# Patient Record
Sex: Female | Born: 1955 | Race: White | Hispanic: No | Marital: Married | Smoking: Never smoker
Health system: Southern US, Community
[De-identification: ages and names within clinical notes are randomized; demographics above are authoritative.]

## PROBLEM LIST (undated history)

## (undated) DIAGNOSIS — I4891 Unspecified atrial fibrillation: Secondary | ICD-10-CM

## (undated) DIAGNOSIS — F32A Depression, unspecified: Secondary | ICD-10-CM

## (undated) DIAGNOSIS — I209 Angina pectoris, unspecified: Secondary | ICD-10-CM

## (undated) DIAGNOSIS — I10 Essential (primary) hypertension: Secondary | ICD-10-CM

## (undated) DIAGNOSIS — N200 Calculus of kidney: Secondary | ICD-10-CM

## (undated) DIAGNOSIS — K219 Gastro-esophageal reflux disease without esophagitis: Secondary | ICD-10-CM

## (undated) DIAGNOSIS — I499 Cardiac arrhythmia, unspecified: Secondary | ICD-10-CM

## (undated) DIAGNOSIS — C801 Malignant (primary) neoplasm, unspecified: Secondary | ICD-10-CM

## (undated) DIAGNOSIS — E785 Hyperlipidemia, unspecified: Secondary | ICD-10-CM

## (undated) DIAGNOSIS — F329 Major depressive disorder, single episode, unspecified: Secondary | ICD-10-CM

## (undated) DIAGNOSIS — I2510 Atherosclerotic heart disease of native coronary artery without angina pectoris: Secondary | ICD-10-CM

## (undated) DIAGNOSIS — E119 Type 2 diabetes mellitus without complications: Secondary | ICD-10-CM

## (undated) DIAGNOSIS — J189 Pneumonia, unspecified organism: Secondary | ICD-10-CM

## (undated) DIAGNOSIS — R0602 Shortness of breath: Secondary | ICD-10-CM

## (undated) DIAGNOSIS — F419 Anxiety disorder, unspecified: Secondary | ICD-10-CM

## (undated) DIAGNOSIS — G4730 Sleep apnea, unspecified: Secondary | ICD-10-CM

## (undated) HISTORY — PX: CHOLECYSTECTOMY: SHX55

## (undated) HISTORY — DX: Type 2 diabetes mellitus without complications: E11.9

## (undated) HISTORY — DX: Major depressive disorder, single episode, unspecified: F32.9

## (undated) HISTORY — DX: Hyperlipidemia, unspecified: E78.5

## (undated) HISTORY — DX: Essential (primary) hypertension: I10

## (undated) HISTORY — PX: COLONOSCOPY W/ POLYPECTOMY: SHX1380

## (undated) HISTORY — PX: CARDIAC ELECTROPHYSIOLOGY MAPPING AND ABLATION: SHX1292

## (undated) HISTORY — DX: Unspecified atrial fibrillation: I48.91

## (undated) HISTORY — PX: TONSILLECTOMY: SUR1361

## (undated) HISTORY — DX: Depression, unspecified: F32.A

---

## 1997-06-02 HISTORY — PX: ABDOMINAL HYSTERECTOMY: SHX81

## 1998-12-20 ENCOUNTER — Encounter: Payer: Self-pay | Admitting: Obstetrics and Gynecology

## 1998-12-20 ENCOUNTER — Ambulatory Visit (HOSPITAL_COMMUNITY): Admission: RE | Admit: 1998-12-20 | Discharge: 1998-12-20 | Payer: Self-pay | Admitting: Obstetrics and Gynecology

## 1998-12-24 ENCOUNTER — Ambulatory Visit (HOSPITAL_COMMUNITY): Admission: RE | Admit: 1998-12-24 | Discharge: 1998-12-24 | Payer: Self-pay | Admitting: Obstetrics and Gynecology

## 1998-12-24 ENCOUNTER — Encounter: Payer: Self-pay | Admitting: Obstetrics and Gynecology

## 1999-02-06 ENCOUNTER — Other Ambulatory Visit: Admission: RE | Admit: 1999-02-06 | Discharge: 1999-02-06 | Payer: Self-pay | Admitting: Obstetrics and Gynecology

## 1999-12-26 ENCOUNTER — Encounter: Payer: Self-pay | Admitting: Obstetrics and Gynecology

## 1999-12-26 ENCOUNTER — Ambulatory Visit (HOSPITAL_COMMUNITY): Admission: RE | Admit: 1999-12-26 | Discharge: 1999-12-26 | Payer: Self-pay | Admitting: Obstetrics and Gynecology

## 2000-02-05 ENCOUNTER — Other Ambulatory Visit: Admission: RE | Admit: 2000-02-05 | Discharge: 2000-02-05 | Payer: Self-pay | Admitting: Obstetrics and Gynecology

## 2000-09-28 ENCOUNTER — Ambulatory Visit (HOSPITAL_COMMUNITY): Admission: RE | Admit: 2000-09-28 | Discharge: 2000-09-28 | Payer: Self-pay | Admitting: Gastroenterology

## 2000-09-28 ENCOUNTER — Encounter (INDEPENDENT_AMBULATORY_CARE_PROVIDER_SITE_OTHER): Payer: Self-pay | Admitting: *Deleted

## 2000-12-28 ENCOUNTER — Encounter: Payer: Self-pay | Admitting: Obstetrics and Gynecology

## 2000-12-28 ENCOUNTER — Ambulatory Visit (HOSPITAL_COMMUNITY): Admission: RE | Admit: 2000-12-28 | Discharge: 2000-12-28 | Payer: Self-pay | Admitting: Obstetrics and Gynecology

## 2001-03-08 ENCOUNTER — Other Ambulatory Visit: Admission: RE | Admit: 2001-03-08 | Discharge: 2001-03-08 | Payer: Self-pay | Admitting: Obstetrics and Gynecology

## 2001-08-11 ENCOUNTER — Ambulatory Visit (HOSPITAL_COMMUNITY): Admission: RE | Admit: 2001-08-11 | Discharge: 2001-08-11 | Payer: Self-pay | Admitting: Cardiology

## 2001-08-11 ENCOUNTER — Encounter: Payer: Self-pay | Admitting: Cardiology

## 2002-01-04 ENCOUNTER — Ambulatory Visit (HOSPITAL_COMMUNITY): Admission: RE | Admit: 2002-01-04 | Discharge: 2002-01-04 | Payer: Self-pay | Admitting: Obstetrics and Gynecology

## 2002-01-04 ENCOUNTER — Encounter: Payer: Self-pay | Admitting: Obstetrics and Gynecology

## 2002-04-11 ENCOUNTER — Other Ambulatory Visit: Admission: RE | Admit: 2002-04-11 | Discharge: 2002-04-11 | Payer: Self-pay | Admitting: Obstetrics and Gynecology

## 2003-01-11 ENCOUNTER — Encounter: Payer: Self-pay | Admitting: Obstetrics and Gynecology

## 2003-01-11 ENCOUNTER — Ambulatory Visit (HOSPITAL_COMMUNITY): Admission: RE | Admit: 2003-01-11 | Discharge: 2003-01-11 | Payer: Self-pay | Admitting: Obstetrics and Gynecology

## 2003-01-19 ENCOUNTER — Encounter: Admission: RE | Admit: 2003-01-19 | Discharge: 2003-01-19 | Payer: Self-pay | Admitting: Obstetrics and Gynecology

## 2003-01-19 ENCOUNTER — Encounter: Payer: Self-pay | Admitting: Obstetrics and Gynecology

## 2003-06-05 ENCOUNTER — Other Ambulatory Visit: Admission: RE | Admit: 2003-06-05 | Discharge: 2003-06-05 | Payer: Self-pay | Admitting: Obstetrics and Gynecology

## 2004-03-12 ENCOUNTER — Ambulatory Visit (HOSPITAL_COMMUNITY): Admission: RE | Admit: 2004-03-12 | Discharge: 2004-03-12 | Payer: Self-pay | Admitting: Obstetrics and Gynecology

## 2004-06-02 HISTORY — PX: BLADDER TUMOR EXCISION: SHX238

## 2004-07-15 ENCOUNTER — Other Ambulatory Visit: Admission: RE | Admit: 2004-07-15 | Discharge: 2004-07-15 | Payer: Self-pay | Admitting: Obstetrics and Gynecology

## 2004-08-29 ENCOUNTER — Ambulatory Visit (HOSPITAL_COMMUNITY): Admission: RE | Admit: 2004-08-29 | Discharge: 2004-08-29 | Payer: Self-pay | Admitting: Urology

## 2004-08-29 ENCOUNTER — Encounter (INDEPENDENT_AMBULATORY_CARE_PROVIDER_SITE_OTHER): Payer: Self-pay | Admitting: Specialist

## 2004-08-29 ENCOUNTER — Ambulatory Visit (HOSPITAL_BASED_OUTPATIENT_CLINIC_OR_DEPARTMENT_OTHER): Admission: RE | Admit: 2004-08-29 | Discharge: 2004-08-29 | Payer: Self-pay | Admitting: Urology

## 2005-05-13 ENCOUNTER — Ambulatory Visit (HOSPITAL_COMMUNITY): Admission: RE | Admit: 2005-05-13 | Discharge: 2005-05-13 | Payer: Self-pay | Admitting: Obstetrics and Gynecology

## 2005-10-13 ENCOUNTER — Emergency Department (HOSPITAL_COMMUNITY): Admission: EM | Admit: 2005-10-13 | Discharge: 2005-10-13 | Payer: Self-pay | Admitting: Emergency Medicine

## 2006-05-18 ENCOUNTER — Ambulatory Visit (HOSPITAL_COMMUNITY): Admission: RE | Admit: 2006-05-18 | Discharge: 2006-05-18 | Payer: Self-pay | Admitting: Obstetrics and Gynecology

## 2006-11-09 ENCOUNTER — Ambulatory Visit: Payer: Self-pay | Admitting: Physician Assistant

## 2006-11-13 ENCOUNTER — Ambulatory Visit: Payer: Self-pay | Admitting: Physician Assistant

## 2006-11-18 ENCOUNTER — Ambulatory Visit: Payer: Self-pay | Admitting: Cardiology

## 2006-11-20 ENCOUNTER — Ambulatory Visit: Payer: Self-pay | Admitting: Cardiology

## 2006-11-20 ENCOUNTER — Inpatient Hospital Stay (HOSPITAL_COMMUNITY): Admission: EM | Admit: 2006-11-20 | Discharge: 2006-11-24 | Payer: Self-pay | Admitting: Emergency Medicine

## 2006-11-20 ENCOUNTER — Ambulatory Visit: Payer: Self-pay | Admitting: Physician Assistant

## 2006-12-03 ENCOUNTER — Ambulatory Visit: Payer: Self-pay | Admitting: Cardiology

## 2006-12-18 ENCOUNTER — Ambulatory Visit: Payer: Self-pay | Admitting: Physician Assistant

## 2006-12-21 ENCOUNTER — Inpatient Hospital Stay (HOSPITAL_COMMUNITY): Admission: AD | Admit: 2006-12-21 | Discharge: 2006-12-22 | Payer: Self-pay | Admitting: Internal Medicine

## 2007-01-13 ENCOUNTER — Ambulatory Visit (HOSPITAL_COMMUNITY): Admission: RE | Admit: 2007-01-13 | Discharge: 2007-01-13 | Payer: Self-pay | Admitting: Internal Medicine

## 2007-01-13 ENCOUNTER — Ambulatory Visit: Payer: Self-pay | Admitting: Cardiology

## 2007-01-15 ENCOUNTER — Ambulatory Visit: Payer: Self-pay | Admitting: Internal Medicine

## 2007-03-03 ENCOUNTER — Ambulatory Visit (HOSPITAL_COMMUNITY): Admission: RE | Admit: 2007-03-03 | Discharge: 2007-03-03 | Payer: Self-pay | Admitting: Cardiology

## 2007-03-15 ENCOUNTER — Ambulatory Visit: Payer: Self-pay | Admitting: Internal Medicine

## 2007-06-11 ENCOUNTER — Ambulatory Visit: Payer: Self-pay | Admitting: Internal Medicine

## 2007-07-05 ENCOUNTER — Ambulatory Visit (HOSPITAL_COMMUNITY): Admission: RE | Admit: 2007-07-05 | Discharge: 2007-07-05 | Payer: Self-pay | Admitting: Obstetrics and Gynecology

## 2008-01-05 ENCOUNTER — Encounter: Admission: RE | Admit: 2008-01-05 | Discharge: 2008-01-05 | Payer: Self-pay | Admitting: Obstetrics and Gynecology

## 2008-03-14 ENCOUNTER — Encounter: Admission: RE | Admit: 2008-03-14 | Discharge: 2008-03-14 | Payer: Self-pay | Admitting: Obstetrics and Gynecology

## 2008-08-21 ENCOUNTER — Emergency Department (HOSPITAL_COMMUNITY): Admission: EM | Admit: 2008-08-21 | Discharge: 2008-08-21 | Payer: Self-pay | Admitting: Emergency Medicine

## 2008-08-24 ENCOUNTER — Ambulatory Visit (HOSPITAL_COMMUNITY): Admission: AD | Admit: 2008-08-24 | Discharge: 2008-08-25 | Payer: Self-pay | Admitting: Urology

## 2008-09-07 ENCOUNTER — Ambulatory Visit (HOSPITAL_COMMUNITY): Admission: RE | Admit: 2008-09-07 | Discharge: 2008-09-07 | Payer: Self-pay | Admitting: Urology

## 2008-09-20 DIAGNOSIS — E669 Obesity, unspecified: Secondary | ICD-10-CM | POA: Insufficient documentation

## 2008-09-20 DIAGNOSIS — I4892 Unspecified atrial flutter: Secondary | ICD-10-CM | POA: Insufficient documentation

## 2008-09-20 DIAGNOSIS — R5381 Other malaise: Secondary | ICD-10-CM

## 2008-09-20 DIAGNOSIS — E785 Hyperlipidemia, unspecified: Secondary | ICD-10-CM

## 2008-09-20 DIAGNOSIS — I10 Essential (primary) hypertension: Secondary | ICD-10-CM | POA: Insufficient documentation

## 2008-09-20 DIAGNOSIS — I4891 Unspecified atrial fibrillation: Secondary | ICD-10-CM | POA: Insufficient documentation

## 2008-09-20 DIAGNOSIS — E119 Type 2 diabetes mellitus without complications: Secondary | ICD-10-CM | POA: Insufficient documentation

## 2008-09-20 DIAGNOSIS — R5383 Other fatigue: Secondary | ICD-10-CM

## 2008-10-10 ENCOUNTER — Encounter: Payer: Self-pay | Admitting: Internal Medicine

## 2008-10-11 ENCOUNTER — Encounter: Payer: Self-pay | Admitting: Internal Medicine

## 2008-12-08 ENCOUNTER — Encounter: Payer: Self-pay | Admitting: Internal Medicine

## 2008-12-19 ENCOUNTER — Encounter: Payer: Self-pay | Admitting: Internal Medicine

## 2009-01-02 ENCOUNTER — Encounter: Admission: RE | Admit: 2009-01-02 | Discharge: 2009-01-02 | Payer: Self-pay | Admitting: Obstetrics and Gynecology

## 2009-06-26 ENCOUNTER — Encounter: Payer: Self-pay | Admitting: Internal Medicine

## 2009-08-14 ENCOUNTER — Encounter: Payer: Self-pay | Admitting: Internal Medicine

## 2010-01-09 ENCOUNTER — Encounter: Payer: Self-pay | Admitting: Internal Medicine

## 2010-02-19 ENCOUNTER — Encounter: Payer: Self-pay | Admitting: Internal Medicine

## 2010-06-23 ENCOUNTER — Encounter: Payer: Self-pay | Admitting: Obstetrics and Gynecology

## 2010-07-04 NOTE — Letter (Signed)
Summary: [facility]   [facility]   Imported By: Roderic Ovens 08/22/2009 13:47:44  _____________________________________________________________________  External Attachment:    Type:   Image     Comment:   External Document

## 2010-07-04 NOTE — Consult Note (Signed)
Summary: [facility]  [facility]   Imported By: Marylou Mccoy 10/30/2009 16:45:21  _____________________________________________________________________  External Attachment:    Type:   Image     Comment:   External Document

## 2010-07-04 NOTE — Letter (Signed)
Summary: [facility] Medical Cardiology Follow Up   [facility] Medical Cardiology Follow Up   Imported By: Roderic Ovens 09/26/2009 15:53:29  _____________________________________________________________________  External Attachment:    Type:   Image     Comment:   External Document

## 2010-07-04 NOTE — Letter (Signed)
Summary: [facility] Medical Cardiology Follow Up  [facility] Medical Cardiology Follow Up   Imported By: Roderic Ovens 03/15/2010 12:40:47  _____________________________________________________________________  External Attachment:    Type:   Image     Comment:   External Document

## 2010-07-04 NOTE — Letter (Signed)
Summary: [facility] Medical Arrhythmia Clinic Note   [facility] Medical Arrhythmia Clinic Note   Imported By: Roderic Ovens 03/01/2010 15:49:35  _____________________________________________________________________  External Attachment:    Type:   Image     Comment:   External Document

## 2010-07-15 ENCOUNTER — Other Ambulatory Visit: Payer: Self-pay

## 2010-07-15 ENCOUNTER — Ambulatory Visit
Admission: RE | Admit: 2010-07-15 | Discharge: 2010-07-15 | Disposition: A | Discharge status: Home / Self Care | Payer: BC Managed Care – PPO | Source: Ambulatory Visit

## 2010-07-15 DIAGNOSIS — G5740 Lesion of medial popliteal nerve, unspecified lower limb: Secondary | ICD-10-CM

## 2010-09-03 ENCOUNTER — Other Ambulatory Visit: Payer: Self-pay | Admitting: Obstetrics and Gynecology

## 2010-09-03 DIAGNOSIS — Z1231 Encounter for screening mammogram for malignant neoplasm of breast: Secondary | ICD-10-CM

## 2010-09-05 ENCOUNTER — Ambulatory Visit: Payer: BC Managed Care – PPO

## 2010-09-09 ENCOUNTER — Ambulatory Visit
Admission: RE | Admit: 2010-09-09 | Discharge: 2010-09-09 | Disposition: A | Discharge status: Home / Self Care | Payer: BC Managed Care – PPO | Source: Ambulatory Visit | Attending: Obstetrics and Gynecology | Admitting: Obstetrics and Gynecology

## 2010-09-09 DIAGNOSIS — Z1231 Encounter for screening mammogram for malignant neoplasm of breast: Secondary | ICD-10-CM

## 2010-09-11 ENCOUNTER — Other Ambulatory Visit: Payer: Self-pay | Admitting: Obstetrics and Gynecology

## 2010-09-11 DIAGNOSIS — R928 Other abnormal and inconclusive findings on diagnostic imaging of breast: Secondary | ICD-10-CM

## 2010-09-11 LAB — BASIC METABOLIC PANEL
Calcium: 9 mg/dL (ref 8.4–10.5)
Creatinine, Ser: 0.97 mg/dL (ref 0.4–1.2)
Potassium: 3.8 mEq/L (ref 3.5–5.1)

## 2010-09-11 LAB — PROTIME-INR
INR: 1.2 (ref 0.00–1.49)
Prothrombin Time: 15.2 seconds (ref 11.6–15.2)

## 2010-09-11 LAB — HEMOGLOBIN AND HEMATOCRIT, BLOOD: Hemoglobin: 10.6 g/dL — ABNORMAL LOW (ref 12.0–15.0)

## 2010-09-12 LAB — DIFFERENTIAL
Basophils Absolute: 0 10*3/uL (ref 0.0–0.1)
Basophils Relative: 0 % (ref 0–1)
Eosinophils Absolute: 0.1 10*3/uL (ref 0.0–0.7)
Eosinophils Relative: 1 % (ref 0–5)
Monocytes Absolute: 0.5 10*3/uL (ref 0.1–1.0)
Monocytes Relative: 4 % (ref 3–12)
Neutro Abs: 11.2 10*3/uL — ABNORMAL HIGH (ref 1.7–7.7)

## 2010-09-12 LAB — COMPREHENSIVE METABOLIC PANEL
ALT: 15 U/L (ref 0–35)
AST: 17 U/L (ref 0–37)
Albumin: 3.4 g/dL — ABNORMAL LOW (ref 3.5–5.2)
Alkaline Phosphatase: 66 U/L (ref 39–117)
BUN: 11 mg/dL (ref 6–23)
Chloride: 108 mEq/L (ref 96–112)
Potassium: 3.4 mEq/L — ABNORMAL LOW (ref 3.5–5.1)
Sodium: 142 mEq/L (ref 135–145)
Total Bilirubin: 0.8 mg/dL (ref 0.3–1.2)
Total Protein: 6.8 g/dL (ref 6.0–8.3)

## 2010-09-12 LAB — BASIC METABOLIC PANEL
BUN: 17 mg/dL (ref 6–23)
BUN: 18 mg/dL (ref 6–23)
CO2: 28 mEq/L (ref 19–32)
CO2: 29 mEq/L (ref 19–32)
Calcium: 8.2 mg/dL — ABNORMAL LOW (ref 8.4–10.5)
Chloride: 100 mEq/L (ref 96–112)
Creatinine, Ser: 1.73 mg/dL — ABNORMAL HIGH (ref 0.4–1.2)
GFR calc Af Amer: 38 mL/min — ABNORMAL LOW (ref >60)
Glucose, Bld: 117 mg/dL — ABNORMAL HIGH (ref 70–99)
Potassium: 3.6 mEq/L (ref 3.5–5.1)
Sodium: 137 mEq/L (ref 135–145)

## 2010-09-12 LAB — URINE MICROSCOPIC-ADD ON

## 2010-09-12 LAB — URINALYSIS, ROUTINE W REFLEX MICROSCOPIC
Bilirubin Urine: NEGATIVE
Glucose, UA: NEGATIVE mg/dL
Ketones, ur: 15 mg/dL — AB
Nitrite: NEGATIVE
Specific Gravity, Urine: 1.011 (ref 1.005–1.030)
pH: 8 (ref 5.0–8.0)

## 2010-09-12 LAB — CBC
HCT: 39.1 % (ref 36.0–46.0)
HCT: 30.6 % — ABNORMAL LOW (ref 36.0–46.0)
Hemoglobin: 10.4 g/dL — ABNORMAL LOW (ref 12.0–15.0)
MCHC: 34 g/dL (ref 30.0–36.0)
MCV: 88.4 fL (ref 78.0–100.0)
Platelets: 254 10*3/uL (ref 150–400)
RDW: 13.1 % (ref 11.5–15.5)
RDW: 13.5 % (ref 11.5–15.5)
WBC: 13.6 10*3/uL — ABNORMAL HIGH (ref 4.0–10.5)

## 2010-09-12 LAB — AMYLASE: Amylase: 67 U/L (ref 27–131)

## 2010-09-12 LAB — URINE CULTURE
Colony Count: >=100000
Special Requests: POSITIVE

## 2010-09-12 LAB — HEMOGLOBIN AND HEMATOCRIT, BLOOD: HCT: 36 % (ref 36.0–46.0)

## 2010-09-12 LAB — PROTIME-INR: Prothrombin Time: 40.8 seconds — ABNORMAL HIGH (ref 11.6–15.2)

## 2010-09-12 LAB — APTT: aPTT: 33 seconds (ref 24–37)

## 2010-09-13 ENCOUNTER — Ambulatory Visit
Admission: RE | Admit: 2010-09-13 | Discharge: 2010-09-13 | Disposition: A | Discharge status: Home / Self Care | Payer: BC Managed Care – PPO | Source: Ambulatory Visit | Attending: Obstetrics and Gynecology | Admitting: Obstetrics and Gynecology

## 2010-09-13 DIAGNOSIS — R928 Other abnormal and inconclusive findings on diagnostic imaging of breast: Secondary | ICD-10-CM

## 2010-10-15 NOTE — Assessment & Plan Note (Signed)
[facility]                         ELECTROPHYSIOLOGY OFFICE NOTE   DELBERT, VU                        MRN:          [id number]  DATE:03/15/2007                            DOB:          1955-08-05    Ms. Gloster is seen following cardioversion.  She was initially  cardioverted after a couple weeks of amiodarone.  This failed to hold.  She was then cardioverted again a couple of weeks ago and has been  holding sinus rhythm.   There is also an issue about her statin therapy.  She was on Vytorin.  She was switched over to Zocor, and I told her that she can continue  taking this as long as she is taking less than 20 mg a day.   She is supposed to have an appointment to see Dr. Clydie Braun over  at [facility] to consider pulmonary vein isolation.   We will plan to see her again in 3 months' time.   I should note that on examination today, her blood pressure was 138/82.  Her pulse was 70.  LUNGS:  Clear.  HEART SOUNDS:  Regular.  EXTREMITIES:  Without peripheral edema.   IMPRESSION:  1. Paroxysmal atrial fibrillation following cardioversion x2.  2. Amiodarone for the above at 400 mg daily.  3. Dyslipidemia on statin therapy with Zocor.   Ms. Chalupa is okay.  We are going to continue on her amiodarone at 400  mg a day for right now.  We will hopefully get her in to see Dr.  Sampson Goon in the next number of weeks at which time her amiodarone dose  can be decreased to 300 mg a day.   We will see her again in 3 months' time.     Duke Salvia, MD, [facility]  Electronically Signed    SCK/MedQ  DD: 03/15/2007  DT: 03/15/2007  Job #: [id number]   cc:   Naida Sleight  Dr. Clydie Braun, [facility]

## 2010-10-15 NOTE — Assessment & Plan Note (Signed)
[facility]                         ELECTROPHYSIOLOGY OFFICE NOTE   SORAIYA, AHNER                        MRN:          [id number]  DATE:06/11/2007                            DOB:          [birth year]    HISTORY:  Larra Crunkleton comes in now 3 weeks status post coronary  isolation undertaken by Dr. Sampson Goon.  I have not yet received the  operative notes, but it sounds like she had both right atrial and left  atrial ablation.  She has had no atrial fibrillation since.  Her  amiodarone was discontinued at the time of the procedure.  She has had  occasional flutters.   MEDICATIONS:  1. Diovan HCT.  2. Cardizem.   PHYSICAL EXAMINATION:  VITAL SIGNS:  Her blood pressure today was well  controlled at 123/79, heart rate was 74.  LUNGS:  Clear.  HEART:  Sounds were regular.  EXTREMITIES:  Without edema.   IMPRESSION:  1. Atrial fibrillation status post coronary isolation.  2. Hypertension.  3. Obesity.   Ms. Burbano is stable.  We will see her again in 6 months' time, but if  she has followed up with Dr. Sampson Goon at that point, she will cancel  this appointment here.     Duke Salvia, MD, [facility]  Electronically Signed    SCK/MedQ  DD: 06/11/2007  DT: 06/11/2007  Job #: [id number]   cc:   Learta Codding, MD,FACC  Mick Sell, MD

## 2010-10-15 NOTE — Assessment & Plan Note (Signed)
[facility]                          [place] [facility] OFFICE NOTE   [name], [name]                        MRN:          [id number]  DATE:11/09/2006                            DOB:          1956/03/26    CHIEF COMPLAINT:  Atrial fibrillation/flutter.   HISTORY OF PRESENT ILLNESS:  [name] is a 55 year old female patient  with past history of atrial fibrillation/flutter dating back to 2002.  She had been previously followed by Dr. Dorethea Clan at [facility].  He has subsequently retired and recently she had been  followed by Robet Leu, NP.  She was cardioverted first in 2003.  She  has been on flecainide since that time.  She went back out of rhythm  this past year and was set up for cardioversion in April of 2008.  She  has had no followup with Cardiology at [facility] since that time.  However, the patient notes that within about 12 hours she felt that she  was back out of rhythm.  This was not documented until she saw Dr.  Reuel Boom back in followup a few weeks ago.  She is referred for further  evaluation and treatment of her arrhythmia.   The patient notes that when she is out of rhythm she often feels  fatigued and says that she has no energy, however these symptoms are not  exactly consistent when she goes out of rhythm.  She knows that when she  is in sinus rhythm she often feels the same.  She does however note  palpitations from time to time.  She denies syncope or near syncope.  She does note some chest discomfort.  She notes this sometimes with  exertion.  She also notes it sometimes with rest.  She notes some  symptoms in her right arm as well.  This seems to be more an association  with her palpitations then with anything.  They usually last less than  10 minutes.  There is no associated nausea or diaphoresis.  Relaxation  usually makes the symptoms better.  She has sleep apnea and is on CPAP.  She slept on 2  pillows for years without changes recently.  She did not  notice any paroxysmal nocturnal dyspnea.  Denies any lower extremity  edema.  Denies any cough.   PAST MEDICAL HISTORY:  1. As noted above is significant for atrial fibrillation/flutter with      cardioversion in 2003 and April of 2008.  She is on flecainide and      Coumadin therapy.  Her Coumadin is followed by Dr. Reuel Boom.  2. She underwent stress testing and echocardiogram at [facility] in 2004 - the patient notes that these were okay.      Records are pending.  3. Borderline diabetes mellitus.  4. Hypertension.  5. Hyperlipidemia.  6. History of bladder cancer, status post resection.  7. Nephrolithiasis.  8. Sleep apnea on CPAP therapy.  9. Status post cholecystectomy.  10.Status post total abdominal hysterectomy and bilateral salpingo-  oophorectomy.  11.History of depression.  12.Seasonal allergies.   CURRENT MEDICATIONS:  1. Flecainide 100 mg b.i.d.  2. Prozac 20 mg daily.  3. Cardizem 240 mg daily.  4. Estrogen 2 mg daily.  5. Potassium 10 mEq b.i.d.  6. Vytorin 10/80 mg daily.  7. Diovan/HCT 320/25 mg daily.  8. Allegra 180 mg daily.  9. Coumadin as directed.   ALLERGIES:  SHE DENIES ANY TRUE ALLERGIES, ATENOLOL DID MAKE HER  SEVERELY FATIGUED.   SOCIAL HISTORY:  She denies any tobacco abuse.  Denies any alcohol  abuse.  She is married, has no children.  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   FAMILY HISTORY:  Significant for coronary artery disease.  Her father  had bypass surgery at age 27. Her mother died at age 25 from colon  cancer.  Her father did die at 39 from a stroke.   REVIEW OF SYSTEMS:  Please see HPI.  Denies any fevers, chills.  She has  a nonproductive cough secondary to her allergies.  Denies any  hemoptysis.  Denies any facial drooping or monocular blindness,  unilateral weakness or difficulty with speech.  Denies any melena  or  hematochezia.  She does have occasional hematuria that is followed by  [facility] in [place].  This has been fairly chronic since her  bladder surgery.  Rest of the review of systems are negative.   PHYSICAL EXAMINATION:  She is a well-nourished, well-developed female in  no distress.  Blood pressure is 133/91, pulse 71, weight 220 pounds.  HEENT:  Normal.  NECK:  Without JVD.  LYMPH:  Without lymphadenopathy.  ENDOCRINE:  Without thyromegaly.  CAROTIDS:  Without bruits bilaterally.  CARDIAC:  Normal S1-S2, irregularly irregular rhythm, no murmurs.  LUNGS:  Clear to auscultation bilaterally without wheezing, rhonchi, or  rales.  ABDOMEN:  Soft, nontender, with normoactive bowel sounds, no  organomegaly.  EXTREMITIES:  Without edema bilaterally.  Calves are soft, nontender.  SKIN:  Warm and dry.  NEUROLOGIC:  She is alert and oriented x3, cranial nerves II-XII grossly  intact.   Electrocardiogram reveals probable atrial flutter with variable AV  block, although it can not rule out the possibility of coarse atrial  fibrillation, heart rate 75.   IMPRESSION:  1. Persistent atrial fibrillation/flutter.      a.     History of cardioversion x2, the last of which was in April       2008.      b.     Flecainide therapy.      c.     Coumadin therapy - followed by Dr. Reuel Boom.  2. Chest pain.  3. Hypertension.  4. Hyperlipidemia, treated.  5. Borderline diabetes mellitus.  6. Family history of coronary artery disease.  7. Sleep apnea on continuous positive airway pressure.  8. History of bladder cancer, status post resection.  9. History of depression.  10.Seasonal allergies.  11.Status post total abdominal hysterectomy/bilateral salpingo-      oophorectomy.  12.Status post cholecystectomy.  13.Nephrolithiasis.   PLAN:  The patient presents to the office today for establishment.  She has a history of atrial fibrillation/flutter.  As noted her  electrocardiogram today  looks to be fairly consistent with atrial  flutter with variable atrioventricular block.  However, there is a  possibility that this just represents coarse atrial fibrillation.  In  any event she had previously been followed at [facility].  We will need to  obtain those records.  She is still on  flecainide therapy even though she failed her last cardioversion.  However, she did not really have followup with the doctor at [facility]  after her last cardioversion.  I discussed the patient's case today with  Dr. Myrtis Ser via telephone.  At this point in time we recommend keeping her  on the flecainide and checking a flecainide trough level.  Will also get  an echocardiogram.  We will get an adenosine Cardiolite as she is  complaining of some chest discomfort.  We will also get a 48 hour Holter  monitor to assess her rate control.  I did discuss with her today the  possibility of keeping her in atrial fibrillation and just treating her  with rate control therapy. However she notes that she does feel that her  symptoms are such that she is wants to try to get back into normal sinus  rhythm.  I will have her follow up with either Dr. Myrtis Ser or Dr. Andee Lineman in  the next several weeks after the above testing is completed.  At that  point in time we can make a decision regarding increasing her flecainide  and trying another cardioversion versus another anti-arrhythmic.  She  may indeed need referral to electrophysiology with Dr. Ladona Ridgel or Dr.  Graciela Husbands in [place].  We will obtain records from [facility] as  well as international normalized ratio levels from Dr. Reuel Boom.      Tereso Newcomer, PA-C  Electronically Signed      Luis Abed, MD, [facility]  Electronically Signed   SW/MedQ  DD: 11/09/2006  DT: 11/09/2006  Job #: [id number]

## 2010-10-15 NOTE — Discharge Summary (Signed)
NAMERIFKA, RAMEY NO.:  [id number]   MEDICAL RECORD NO.:  [id number]          PATIENT TYPE:  INP   LOCATION:  3738                         FACILITY:  [facility]   PHYSICIAN:  [name]. [name], [name], [name]:  Jan 22, 1956   DATE OF ADMISSION:  11/20/2006  DATE OF DISCHARGE:  11/24/2006                               DISCHARGE SUMMARY   DISCHARGE DIAGNOSES:  1. Chest discomfort with abnormal Myoview.  2. Coronary artery disease with normal left ventricular function.  3. Hyperglycemia with an elevated hemoglobin A1c.  4. Hyperlipidemia.  5. Atrial fibrillation with controlled ventricular rate.  6. History as noted below.   PRIMARY CARE PHYSICIAN:  [name], [name]   SUMMARY OF HISTORY:  [name] is a 55 year old female who was seen in  the [name] office on the 20th after an adenosine Myoview on June 18 was  found to be abnormal with a small to moderate anterior defect which was  felt to be reversible.  Her EF was 58%.  She was initially seen for  chest tightness over the preceding 6 months with minimal exertion.  Thus, arrangements were made for her admission and cardiac  catheterization.   Her history is notable for atrial fibrillation/flutter dating back to  2002, initially followed at [name] but  established with our group in June 2008.  She has had two cardioversions  in the past.  The most recent was in April 2008.  She also has been  placed on flecainide and Coumadin and followed by [name].  She has a  history of borderline diabetes, hypertension, hyperlipidemia, bladder  cancer status post resection, nephrolithiasis, sleep apnea on CPAP,  cholecystectomy, TAH with BSO, depression, and seasonal allergies.   LABORATORY DATA:  EKGs show atrial fibrillation, normal axis, early R,  nonspecific ST-T wave changes.  Admission H&H 13.5 and 39.4, normal  indices, platelets 267, WBC 8.9.  On the 24th at the time of discharge  H&H was 13.3 and 39.3, normal indices, platelets 255, WBC 10.0.  Admission PTT was 35, PT 21.9, INR 1.8.  On the 24th at the time of  discharge PT was 13.4, INR 1.0.  Admission sodium was 139, potassium  3.3, BUN 8, creatinine 0.56, normal LFTs, glucose 120.  At the time of  discharge sodium was 139, potassium 3.9, BUN 14, creatinine 0.55,  glucose 133.  Hemoglobin A1c was slightly elevated at 6.9.  CK, MBs  relative indices and troponins were within normal limits x3.  TSH was  2.164.  Urinalysis was positive for blood and 100 mg/dl of protein and  squamous epithelial cells.  Fasting lipids showed a total cholesterol of  159, triglycerides 220, HDL 40, LDL 75.   HOSPITAL COURSE:  [name] was admitted to [name].  EP was  asked to see in consultation in regards to her atrial fibrillation and  history of atrial flutter.  Given her recent abnormal Cardiolite, Dr.  Graciela Coleman agreed with cardiac catheterization, long-term Coumadin with her  CHADS score of 1, and probable 1C therapy and follow up with Dr.  Wynona Coleman for pulmonary vein isolation ablation.  Overnight she ruled out  for myocardial infarction.  Cardiac catheterization was performed on  November 23, 2006, by [name].  She had a 30% distal PDA lesion, 40%  ramus, 40% proximal circumflex, normal LV function, 30-40% proximal LAD  at the diagonal #1, 70% proximal diagonal #1, 20-30% percent mid LAD.  [name] favored aggressive risk factor management.  Her Coumadin was  resumed.  The catheterization site was intact.  [name] on the 24th  felt that the patient could be discharged home with outpatient follow-up  with [name] and Graciela Coleman to consider options in regards to  antiarrhythmics after re-anticoagulation.   DISPOSITION:  [name] discharged [name] and according to his  instructions, he asked her to follow up with [name], [name] and  [name].  He asked her to discontinue flecainide.   Her medications  at the time of discharge include:  1. Prozac 20 mg daily.  2. Cardizem CD 240 mg daily.  3. Estrogen daily.  4. Allegra 180 mg daily.  5. Coumadin 6 mg daily.  6. Vytorin.  7. Diovan/HCT 320/25 mg daily.  8. Potassium 10 mEq b.i.d.  9. Metoprolol 25 mg half a tablet b.i.d.   I will contact our [place] office to ask them to contact the patient at  home to provide her with a follow-up appointment with [name] and  ensure that she has a follow-up appointment with [name].  We will  also ask her to obtain a PT/INR with [name] in approximately 1 week.   DISCHARGE TIME:  20 minutes pacing.      [name], [name]      [name]. [name], [name], [name]  Electronically Signed    EW/MEDQ  D:  11/24/2006  T:  11/24/2006  Job:  [id number]   cc:   [name], [name],[name]  [name], [name]

## 2010-10-15 NOTE — Discharge Summary (Signed)
Judy, Coleman NO.:  [id number]   MEDICAL RECORD NO.:  [id number]          PATIENT TYPE:  OIB   LOCATION:  2852                         FACILITY:  [facility]   PHYSICIAN:  Jonelle Sidle, MD DATE OF BIRTH:  [birth year]   DATE OF ADMISSION:  03/03/2007  DATE OF DISCHARGE:  03/03/2007                               DISCHARGE SUMMARY   Dictation and exam greater than 45 minutes.   FINAL DIAGNOSIS:  1. Presenting March 03, 2007 for DCCV after amiodarone load.  2. The patient is in sinus rhythm October 1, DCCV cancelled.  3. The patient is experiencing pass persistent fatigue for the last      month.  4. Amiodarone dose was increased to 400 mg b.i.d. at office visit      August 15 with intention to cardiovert in 10 days.  5. BUT....patient had trouble maintaining a 4-week stable INR during      the month of September 2008.  6. Amiodarone at 0.8 grams daily since August 15 (possible amiodarone      overload).   PLAN:  1. Decrease amiodarone to 200 mg twice daily (could be decreased      further to 200 mg daily if symptoms persist).  2. Liver function studies and thyroid stimulating hormone drawn      October 1 at [facility].  3. Follow-up with Dr. Sherryl Manges at [facility]      to check lab studies and symptoms as well as EKG for rhythm check.   BRIEF HISTORY:  [name] is a 55 year old female.  She has a history  of atrial fibrillation which dates back to 2002. She is symptomatic in  atrial fibrillation with fluttering sensation and fatigue. She has had  two cardioversions in the past, the first lasted quite some time and the  second lasted only 2 days before relapse. Her last cardioversion was  April 2008, at that time she was placed on flecainide.   She became a [facility] patient June 2008 when she reported  substernal chest pain. A Myoview study was mildly abnormal.  She had a  left heart catheterization on  November 23, 2006. At that time she was in  atrial fibrillation.  The study showed that her ejection fraction was  preserved.  She had a diagonal with a 70% stenosis, otherwise mild  residual disease in the mid-LAD and the first obtuse marginal as well as  the PDA. Her flecainide was discontinued because of her mild coronary  disease.  She was discharged on metoprolol but apparently this caused  weight gain and fatigue as well.   She was seen by Dr. Andee Lineman December 18, 2006. She was set up for inpatient  Tikosyn therapy after consulting with Dr. Graciela Husbands.  The patient presented  December 21, 2006. Her QTc was prolonged at 470.  The patient did not  receive Tikosyn but was started on amiodarone to return for  cardioversion after 4 weeks of therapeutic Coumadin. She presented  August 13 to [facility] with therapeutic INR.  She underwent  direct current cardioversion after conscious sedation and converted to  sinus rhythm. She went home the same day, August 13, to be followed up  by Dr. Graciela Husbands on August 15.   When she saw Dr. Graciela Husbands on August 15 it was found that she had converted  to recurrent atrial fibrillation. Her amiodarone was increased from 200  mg twice a day to 400 mg twice a day with plans to bring her back in  about 10 days for re-cardioversion.  This would have placed her at  return in about the first of September. because of issues with  subtherapeutic INR, the patient was not able to achieve a 4-week stable  INR until the beginning of October.  She presents now to [facility] for this re-cardioversion after further amiodarone loading.   It turns out that the patient has been taking 0.8 grams of amiodarone  for the last month. The patient says she feels very fatigued, hardly  able to do anything.  She is in sinus rhythm and therefore no  cardioversion was attempted today, October 1.   After consultation with Dr. Graciela Husbands, it was decided that we would decrease  the amiodarone  from 400 mg twice daily to 200 mg twice daily.  We could  reduce the dose further if fatigue persisted. She will follow-up with  Dr. Graciela Husbands in the office at the earliest convenience to check if she is  still in sinus rhythm and also to relate possible amelioration of  symptoms with a lower amiodarone dose. At this presentation, liver  function studies and thyroid stimulating hormone have been obtained and  the results will be checked when she presents to the office to see Dr.  Graciela Husbands some time before October 13.   DISCHARGE MEDICATIONS:  1. Amiodarone 200 mg in the morning, 200 mg in the evening.  2. Allegra 180 mg daily.  3. Potassium chloride 10 mEq in the morning. 10 mEq in the evening.  4. Diovan/HCTZ 320/25 daily.  5. Glucosamine.  6. Vitamins C and E.  7. Cardizem 240 mg daily.  8. Prozac 20 mg daily.  9. Estrogen 2 mg daily.  10.Coumadin 5 mg daily or as directed by the Coumadin Clinic.   Her other medical history includes hypertension, dyslipidemia,  borderline diabetes, sleep apnea, history of bladder tumor with mild  hematuria persisting.      Maple Mirza, [place]      Jonelle Sidle, MD  Electronically Signed    GM/MEDQ  D:  03/03/2007  T:  03/04/2007  Job:  [id number]   cc:   Duke Salvia, MD, [facility]

## 2010-10-15 NOTE — Op Note (Signed)
[name], [name]               ACCOUNT NO.:  [id number]   MEDICAL RECORD NO.:  [id number]          PATIENT TYPE:  OIB   LOCATION:  0098                         FACILITY:  [facility]   PHYSICIAN:  Excell Seltzer. Annabell Howells, M.D.    DATE OF BIRTH:  [birth year]   DATE OF PROCEDURE:  08/24/2008  DATE OF DISCHARGE:                               OPERATIVE REPORT   PROCEDURE:  Cystoscopy, left retrograde pyelogram, insertion of left  double-J stent.   PREOPERATIVE DIAGNOSIS:  Left ureteropelvic junction stone.   POSTOPERATIVE DIAGNOSIS:  Left ureteropelvic junction stone with  pyonephrosis and evidence of follicular cystitis.   SURGEON:  Dr. Bjorn Pippin.   ANESTHESIA:  General.   SPECIMEN:  Urine culture from bladder and kidney.   DRAIN:  6-French 24-cm double-J stent.   COMPLICATIONS:  None.   INDICATIONS:  [name] is a 55 year old white female with a history of  stones who had the onset earlier this week of left flank pain.  CT in  the ER revealed a 4 x 8-mm left proximal stone.  She has had persistent  pain and nausea and vomiting and came to the office today.  She also  been feeling hot with shaking chills but no fever.  A KUB in the office  revealed what appeared to be a 5 x 13-mm ovoid stone in the area of the  left UPJ consistent with a stone seen on CT scan.  It was felt that  cystoscopy and stenting was indicated to relieve her pain since she had  been on Coumadin, and her INR had jumped up to 4.5 with an increased  dose.  She will have to have definitive treatment at a later date.   FINDINGS OF PROCEDURE:  The patient was given Cipro.  She was taken to  the operating room, and general anesthetic was induced.  She was placed  in lithotomy position.  Her perineum and genitalia were prepped with  Betadine solution.  She was draped in the usual sterile fashion.  A time  out was performed.  Cysto was performed using a 22-French scope and a 12-  degree lens.  Examination revealed a normal  urethra.  The bladder wall  had mild trabeculation.  There were changes consistent with follicular  cystitis particularly on the posterior wall.  Ureteral orifices were  unremarkable.   The left ureteral orifice was cannulated with a 5-French open-ended  catheter which was advanced into the to the level of stone without  difficulty.  There was some difficulty bypassing the stone, so a sensor  guidewire was passed through the opened-ended catheter by the stone into  the kidney, and then the open-ended catheter was advanced.   A urine specimen was collected on initial cystoscopy.  Her urine was  quite dark and blood tinged.  Once the open-ended catheter was placed  into the kidney, a syringe was used to aspirate content, and she was  noted to have significant pyelonephrosis.  A smaller amount of contrast  was instilled to confirm position.  This demonstrated some blunting of  the calyces without significant filling  defects.  The stone was not well  seen on fluoroscopy.   Once the retrograde pyelogram which revealed some hydronephrosis and the  position in the kidney was completed, the guidewire was reinserted to  the kidney.  The open-ended catheter was removed, and a 6-French 24-cm  double-J stent without string was passed to the kidney under  fluoroscopic guidance.  The wire was removed leaving a good coil in the  kidney and good coil in the bladder.  Purulent urine was noted to efflux  the distally.  At this point, the bladder was drained, and the  cystoscope was removed.  Of note, during the procedure, the patient did  spike a temperature to approximately 101, and her heart rate was  somewhat labile.  It was felt that she would need to be admitted for IV  antibiotics.  There were no complications during the procedure.  Her  anesthetic was reversed.  She was moved to the recovery room in stable  condition.      Excell Seltzer. Annabell Howells, M.D.  Electronically Signed     JJW/MEDQ  D:   08/24/2008  T:  08/24/2008  Job:  [id number]   cc:   Donzetta Sprung  Fax: [phone]

## 2010-10-15 NOTE — Letter (Signed)
January 15, 2007    Learta Codding, MD,FACC  [address]  [place], [place] [address]   RE:  Judy, Coleman  MRN:  [id number]  /  DOB:  1956-01-31   Dear Michelle Piper,   [name] was seen in the hospital a couple of weeks ago for atrial  fibrillation.  We tried Tikosyn, but because of QT prolongation, she did  not take it and she was started on amiodarone.  She came in earlier this  week for cardioversion, which was successful.  However, she has reverted  now to atrial fibrillation.   She is not as symptomatic today as she has been.   Her medications include Metoprolol 25 b.i.d., Diovan 320/25.   Vytorin has been discontinued because of the interactions with  amiodarone.  She is on Cardizem and Prozac.   EXAMINATION TODAY:  Her blood pressure is 136/85, her pulse is 67 and  irregular.  Lungs were clear.  Heart sounds were irregular with an early  systolic murmur.  The extremities were without edema.   We obtained an electrocardiogram today, which demonstrated atrial  fibrillation having recurred.   IMPRESSION:  1. Atrial fibrillation - persistent.  2. Modest nonobstructive coronary disease.  3. QT prolongation, precluding Tikosyn.  4. Current therapy with amiodarone.  5. Coumadin therapy, given thromboembolic risk factors notable for      diabetes and hypertension.   Michelle Piper, [name] has recurrent atrial fibrillation.  I am going to go  ahead and increase her dose and give her about ten days and then  cardiovert her again.  In the interim, I am also going to contact Dr.  Clydie Braun for consideration of pulmonary vein isolation.    Sincerely,      Duke Salvia, MD, [facility]  Electronically Signed    SCK/MedQ  DD: 01/15/2007  DT: 01/16/2007  Job #: [id number]   CC:    Donzetta Sprung

## 2010-10-15 NOTE — Assessment & Plan Note (Signed)
[facility]                          [facility] NOTE   [name], [name]                        MRN:          [id number]  DATE:12/18/2006                            DOB:          07-Nov-1955    REFERRING PHYSICIAN:  Donzetta Sprung   HISTORY OF PRESENT ILLNESS:  The patient is a 55 year old female with a  history of atrial fibrillation and flutter dating back to 2002. She was  previously followed at [facility] by Dr. Cloyde Reams. Most recently, she was  seeing the PA there. She was quite dissatisfied with him and has  transferred her care to the [place] office. When she was seen recently by  Lorin Picket and myself, the patient reported a chest pain. A Cardiolite study  was done which was abnormal. Subsequently, the patient was set up for a  catheterization. The catheterization essentially showed that she had non-  obstructive coronary artery disease. She did have moderate plaque in the  proximal to mid LAD. There was also mild to moderate plaque in the  circumflex coronary artery. It was felt that the patient could be  treated medically. We also asked the patient during this hospitalization  to see Dr. Graciela Husbands, as she remained in atrial fibrillation and we had  questions of whether the patient needed to continue on Flecainide. Dr.  Graciela Husbands saw the patient at that time and gave a recommendation to  discontinue Flecainide and to refer her for radio frequency ablation.  When the patient came to back to see [place] in the office today was doing  quite well. She had no groin complication and no chest pain or shortness  of breath.   However, a couple of days ago, the patient called our nurse and stated  that after she was discharged from [facility] and was placed on  Metoprolol, she started feeling worse. She states that she developed  lower extremity edema. Her weight is up approximately 10 pounds from  previous. She also feels that she has frequent  palpitations.   MEDICATIONS:  1. Prozac 20 mg daily.  2. Cardizem 240 mg daily.  3. Estrogen 2 mg daily.  4. Potassium 10 mEq b.i.d.  5. Vytorin 10/80 mg daily.  6. Diovan/hydrochlorothiazide 320/25 mg daily.  7. Allegra 180 mg daily.  8. Coumadin as directed.  9. Metoprolol 25 mg 1/2 tablet b.i.d.   PHYSICAL EXAMINATION:  VITAL SIGNS:  Blood pressure 128/86, heart rate  66 beats-per-minute, weight 223 pounds.  GENERAL:  Well nourished white female in no apparent distress.  HEENT:  Normal carotid upstrokes; no carotid bruits.  LUNGS:  Clear breath sounds bilaterally.  HEART:  Irregular rate and rhythm. Normal S1, S2.  ABDOMEN:  Soft and nontender. No rebound or guarding. Good bowel sounds.  EXTREMITIES:  No cyanosis or clubbing. There is trace to 1+ pitting  edema.   PROBLEM LIST:  1. Persistent atrial fibrillation.      a.     History of cardioversion x2 last April 2008.      b.     Flecainide discontinued by Dr. Graciela Husbands with  initial       recommendation for radio frequency ablation.      c.     Coumadin therapy followed by Dr. Reuel Boom.  2. Abnormal Cardiolite study with non-obstructive coronary artery      disease by catheterization.  3. Hypertension.  4. Dyslipidemia.  5. Borderline diabetes mellitus.  6. Sleep apnea.  7. Mild lower extremity edema.   PLAN:  1. The patient does appear to have gained some weight. However, she      has normal LV systolic function. She may have diastolic      dysfunction. I have given her a prescription of Lasix only 10 mg      for three days to bring her back to her baseline weight.  2. Initially the patient was recommended by Dr. Graciela Husbands to undergo radio      frequency ablation, however when I discussed this with Dr. Graciela Husbands in      the office today, he decided to bring her into the hospital and      start her on Tikosyn therapy. I have discussed this with the      patient at great length and she is willing to proceed with this.      She is  willing to come in next Monday at [facility].  3. Continued risk factor modification for the patient's mild coronary      artery disease.  4. I am still not entirely clear of why the patient feels worse after      she was placed on Metoprolol therapy. Her heart rate is well      controlled here in the office, but she reports to me that she has      frequent palpitations. I have suggested Dr. Graciela Husbands to consider      CardioNet monitor to actually evaluate her rhythm, but he felt that      this was not necessary at this point in time.     Learta Codding, MD,FACC  Electronically Signed    GED/MedQ  DD: 12/18/2006  DT: 12/19/2006  Job #: [id number]   cc:   Donzetta Sprung

## 2010-10-15 NOTE — Discharge Summary (Signed)
NAMEJORDAIN, RADIN NO.:  [id number]   MEDICAL RECORD NO.:  [id number]          PATIENT TYPE:  INP   LOCATION:  3734                         FACILITY:  [facility]   PHYSICIAN:  Duke Salvia, MD, FACCDATE OF BIRTH:  [birth year]   DATE OF ADMISSION:  12/21/2006  DATE OF DISCHARGE:  12/22/2006                               DISCHARGE SUMMARY   This patient has allergy to ATENOLOL, which causes fatigue.   Dictation and exam greater than 40 minutes.   FINAL DIAGNOSES:  1. Recurrent atrial fibrillation, which causes fatigue.  2. Long, QTc on electrocardiogram this admission.  3. Starting amiodarone oral therapy with follow-up cardioversion      Wednesday, August 13.   SECONDARY DIAGNOSES:  1. Atrial fibrillation first diagnosed 2002.      a.     Symptoms are a fluttering sensation and fatigue.      b.     Two direct current cardioversions in the past.      c.     Flecainide therapy stopped after discovery of moderate but       nonobstructive coronary artery disease at left heart       catheterization November 23, 2006.  2. Admission July 21 to start Tikosyn therapy aborted secondary to      long QT.  3. Ejection fraction of 58% at left heart catheterization November 23, 2006, for substernal chest pain.  The study showed mid left      anterior descending artery lesion of 30-40%.  The diagonal had a      70% lesion, The first obtuse marginal 30-40%.  The takeoff of the      posterior descending artery, had 30% lesion.  The patient is to      continue to manage medically.  4. Hypertension.  5. Dyslipidemia.  6. Borderline diabetes.  7. Sleep apnea.  8. History of bladder tumor with mild hematuria.   PROCEDURES:  None here today.  The patient started on amiodarone  therapy.   BRIEF HISTORY:  Ms. Capano is a 55 year old female who has recurrent  atrial fibrillation.  With fibrillation she feels a fluttering sensation  and she has fatigue.  She has had two  cardioversions in the past.  The  first one lasted quite some time, the second one only lasted 2 days  prior to relapse.  Her last cardioversion was in April 2008.  At that  time she was placed on flecainide.  Her Coumadin is followed by Dr.  Reuel Boom.   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  She had a Myoview study November 18, 2006, which demonstrated a small  to moderate anterior defect.  Ejection fraction was 58%.  Because of  this she had left heart catheterization November 23, 2006.  At that time she  was in atrial fibrillation.  This study showed that her EF was  preserved.  The mid LAD had a 30-40% lesion, diagonal 70%, first obtuse  marginal 30-40%, takeoff of the PDA was 30%.  This  was nonobstructive  coronary artery disease with medical management BUT flecainide  discontinued.  She was discharged on metoprolol, which apparently causes  weight gain and fatigue.  She was seen by Dr. Andee Lineman December 18, 2006, and  set up for inpatient Tikosyn after consulting with Dr. Graciela Husbands.  The  patient presents electively July 21 to begin Tikosyn therapy.   HOSPITAL COURSE:  The patient presents electively for Tikosyn therapy.  Her INR was therapeutic at 2.3,  Her potassium was low at 3.5.  This was  replenished.  Her magnesium was okay at 1.8.  She was taking  hydrochlorothiazide.  This was placed on hold, as was her Allegra.  Everything was prepared to begin Tikosyn therapy on Tuesday, July 22;  however, her electrocardiogram showed that her QT was prolonged, QTc was  470.  The patient prepared for discharge July 22 and started on  amiodarone therapy with outpatient cardioversion Wednesday, August 13.  She will have her Coumadin monitored by the [facility] on  a weekly basis.   Her medications at discharge include:  1. Amiodarone 200 mg tablets 2 in the morning and 2 in the evening      from July 22 to August 5 and then to start 2 tablets daily       Wednesday, August 6.  2. She is to stop metoprolol.  3. Cardizem XL 240 mg.  She is to stop this if her heart rate dips      below 70.  4. Allegra 180 mg daily.  5. Vytorin 10/80 mg daily.  6. Potassium chloride increased slightly, 20 mEq in the morning and 10      mEq in the evening.  7. Diovan/hydrochlorothiazide 320/25 mg daily.  8. Prozac 20 mg daily.  9. Estrogen 2 mg daily.  10.Coumadin 6 mg daily, to reduce to 5 mg daily on Sunday, July 27.  11.Glucosamine 100 mg daily.  12.Vitamin C 500 mg and vitamin E 400 international units daily.   Follow-up with [facility] for pro time Monday, July 28.  She returns to [facility] Wednesday, August 13, at 60.  She  is asked to eat nothing after midnight Tuesday, August 12.  She is to be  on clear liquids, however, until 9 o'clock on August 13.  Dr. Graciela Husbands will  perform the cardioversion at approximately 1 o'clock.  It is asked that  the [facility] and all pro times to [facility],  the fax number is [phone].     Laboratories at discharge on July 22:  Sodium 144, potassium is 4.4,  chloride 110, carbonate 29, BUN is 10, creatinine 0.53, glucose 141.  Her pro time is 28.2, INR is 2.5.   Once again, greater than 40 minutes      Maple Mirza, [place]      Duke Salvia, MD, [facility]  Electronically Signed    GM/MEDQ  D:  12/22/2006  T:  12/22/2006  Job:  [id number]   cc:   Learta Codding, MD,FACC  Donzetta Sprung  [facility]

## 2010-10-15 NOTE — Cardiovascular Report (Signed)
[name], [name] NO.:  [id number]   MEDICAL RECORD NO.:  [id number]          PATIENT TYPE:  INP   LOCATION:  3738                         FACILITY:  [facility]   PHYSICIAN:  Arturo Morton. [name], MD, FACCDATE OF BIRTH:  02-14-1956   DATE OF PROCEDURE:  11/23/2006  DATE OF DISCHARGE:                            CARDIAC CATHETERIZATION   INDICATIONS:  [name] is a 55 year old who has had a history of  atrial fibrillation.  She has been on warfarin anticoagulation.  She  recently had some chest pain and had an abnormal Myoview scan with some  question of LAD disease.  The current study was done to assess coronary  anatomy.  The risks, benefits and alternatives were discussed with the  patient.  I rediscussed these in the catheterization laboratory with  her.  She was agreeable to proceed. Her Coumadin was on hold and her INR  was controlled.   PROCEDURE:  1. Left heart catheterization.  2. Selective coronary arteriography.  3. Selective left ventriculography.   DESCRIPTION OF PROCEDURE:  The patient was brought to the  catheterization laboratory after informed consent, prepped and draped in  usual fashion. Through an anterior puncture the right femoral artery was  entered using a Smart needle.  A 5-French sheath was placed.  Diagnostic  images were then obtained in multiple angiographic projections using a 5-  [place] Judkins catheters.  Central aortic and left ventricular pressures  were measured with pigtail, ventriculography was performed in the RAO  projection.  There were no complications.  The patient tolerated  procedure well.  She was taken to the holding area in satisfactory  clinical condition.   HEMODYNAMIC DATA.:  1. Central aortic pressure 133/87, mean 109.  2. Left ventricular pressure 121/16.  3. No gradient on pullback across aortic valve.   ANGIOGRAPHIC DATA.:  1. Ventriculography was done in the RAO projection.  The patient was      in  atrial fibrillation. Ventriculography in the RAO projection      revealed preserved systolic function and no definite wall motion      abnormalities were visualized although as noted, the patient had      variable cycle lengths with her atrial fibrillation.  2. The left main coronary artery is large in caliber and is free of      critical disease.  3. The left anterior descending artery courses to the apex.  The LAD      demonstrates a segmental area of plaquing in the mid vessel      overlying a small diagonal branch and past the first septal      perforator.  In the midportion of the vessel there is segmental      plaque that measures about 30% luminal reduction throughout the      segment.  At its worst spot, there probably is a focal area of      about 40% just after the takeoff of the small diagonal.  The      diagonal is insignificant and has what appears to be about 70% area  of narrowing.  Just after this, the vessel opens up and there is      some mild calcification and some diffuse luminal irregularity of      the remainder of the LAD with some 20-30% plaquing in the mid to      distal vessel.  The stenosis in the LAD does not appear to be      critical in any view  4. The circumflex is a large-caliber vessel providing two large      marginal branches and posterolateral branch.  The first large      marginal branch has about 30-40% narrowing proximally over a      segmental area and then opens up.  Between the origins of the first      and second marginal branch is a focal area of stenosis measuring      about 40% luminal reduction.  Distally the vessel then opens up and      provides a major marginal branch then a tiny insignificant third      marginal branch. There is a large posterolateral branch that also      appears free of critical disease.  5. The right coronary artery is a vessel that provides more or less a      single posterior descending branch.  It is moderate  size and the      vessel itself has mild luminal irregularity. At the takeoff of the      PDA there is perhaps about 30% mild plaquing just after the small      posterolateral branch origin.  Again the posterolateral system is      really insignificant.   CONCLUSION:  1. Persistent atrial fibrillation.  2. Well-preserved left ventricular function.  3. Moderate plaquing of the proximal/mid left anterior descending      artery as noted in the above text.  4. Mild to moderate plaquing of the circumflex coronary artery.   DISPOSITION:  None of the lesions appear to be highly obstructive.  Aggressive risk factor reduction will be recommended from a coronary  standpoint.  Dr. Graciela Husbands is managing her atrial fibrillation.  The patient  will be restarted back on her Coumadin, and further decisions made  regarding this.  Control of her hypertension and diabetes would be  highly recommended.      Arturo Morton. [name], MD, [facility]  Electronically Signed     TDS/MEDQ  D:  11/23/2006  T:  11/23/2006  Job:  [id number]   cc:   Noah Charon, MD,FACC  Duke Salvia, MD, [facility]

## 2010-10-15 NOTE — Letter (Signed)
January 13, 2007    Donzetta Sprung  [address], [address]  [place], [place] [address]   RE:  RENDY, LAZARD  MRN:  [id number]  /  DOB:  [birth year]   Dear Shirlyn Goltz set Rolla Plate up for a cardioversion after starting  amiodarone.  This was performed successfully at [facility].  She converted with one shock to sinus rhythm.  She is on maintenance  amiodarone and will be seeing Dr. Graciela Husbands in followup.  I did note that  she is on Vytorin, there has been a recent report of interaction with  Zocor and amiodarone, and as a result I have instructed her to stop her  Vytorin.  I have asked her to follow up with you for a replacement  therapy for lipid lowering.   Brett Canales will communicate with you when he sees her back.    Sincerely,      Arturo Morton. [name], MD, [facility]  Electronically Signed    TDS/MedQ  DD: 01/13/2007  DT: 01/14/2007  Job #: [id number]   CC:    Duke Salvia, MD, [facility]

## 2010-10-15 NOTE — Assessment & Plan Note (Signed)
[facility]                          [facility]   [name], [name] Coleman                        MRN:          [id number]  DATE:11/20/2006                            DOB:          [birth year]    HOSPITAL ADMISSION HISTORY AND PHYSICAL   PRIMARY CARE PHYSICIAN:  Dr. Donzetta Sprung.   CHIEF COMPLAINT:  Chest pain, positive Cardiolite.   HISTORY OF PRESENT ILLNESS:  [name] [name] Coleman is a 55 year old female patient  with a history of atrial fibrillation/flutter dating back to 2002 who I  saw initially on November 09, 2006. She established with our practice at that  time. She had previously been followed at [facility]. She was mildly symptomatic with her atrial  fibrillation/flutter and was interested in getting back into normal  sinus rhythm. She had been cardioverted on 2 occasions in the past. She  also describes some chest discomfort, and she had significant risk  factors for coronary disease, and we set her up for an adenosine  Cardiolite. She did have a 48-hour Holter monitor that revealed atrial  fibrillation/flutter, and her heart rate was fairly well controlled with  a mean heart rate in the 80s. Her adenosine Cardiolite was done on November 18, 2006 and returned abnormal with a small to moderate anterior defect  which appeared to be reversible in the distribution of the LAD. Her EF  was 58%. She was brought back today for followup. In the office, she  notes that she is having chest tightness. She actually admits over the  last 6 months to having chest tightness with any minimal exertion that  she does. She says going up steps is the worse. She does note associated  shortness of breath. She denies any shortness of breath in the office  today, nausea, diaphoresis. She denies any syncope or near syncope.   PAST MEDICAL HISTORY:  As noted above, significant for:  1. Atrial fibrillation/flutter.      a.     Status post  cardioversion x1, the last of which was April       2008.      b.     Flecainide therapy.      c.     Coumadin therapy followed by Dr. Reuel Boom.  2. Borderline diabetes mellitus.  3. Hypertension.  4. Hyperlipidemia.  5. History of bladder cancer status post resection.  6. Nephrolithiasis.  7. Sleep apnea on CPAP therapy.  8. Status post cholecystectomy.  9. Status post total abdominal hysterectomy with bilateral salpingo-      oophorectomy.  10.History of depression.  11.Seasonal allergies.   ALLERGIES:  No known drug allergies. ATENOLOL DOES CAUSE HER FATIGUE.   MEDICATIONS:  1. Flecainide 100 mg b.i.d.  2. Prozac 20 mg daily.  3. Cardizem 340 mg daily.  4. Estrogen 2 mg daily.  5. Potassium 10 mEq b.i.d.  6. Vytorin 10/80 mg daily.  7. Diovan HCT 320/25 mg daily.  8. Allegra 180 mg daily.  9. Coumadin as directed.   SOCIAL HISTORY:  She denies tobacco or alcohol abuse. She is  married and  has no children. 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   FAMILY HISTORY:  Significant for coronary disease. Her father had bypass  surgery at age 54, and her mother died at age 41 from colon cancer.   REVIEW OF SYSTEMS:  Please see H&P. Denies any melena, hematochezia,  hematuria, dysuria, fevers, chills or cough. The rest of review of  systems is negative.   PHYSICAL EXAMINATION:  She is a well-nourished, well-developed female  who is quite tearful in the office. She is in no acute distress. Blood  pressure 119/79, pulse 100, weight 221 pounds.  HEENT:  Is normal.  NECK:  Without JVD. Carotids without bruits bilaterally.  ENDOCRINE:  Without thyromegaly.  LYMPH:  Without lymphadenopathy.  CARDIAC:  Normal S1 and S2. Irregular, irregular rhythm.  LUNGS:  Were clear to auscultation bilaterally without wheeze, rhonchi  or rales.  ABDOMEN:  Soft, nontender with normal active bowel sounds. No  organomegaly.  EXTREMITIES:  With trace edema  bilaterally. Calves soft, nontender.  SKIN:  Warm, dry.  NEUROLOGICAL:  She is alert and oriented x3. Cranial nerves II-XII  grossly intact.   Electrocardiogram reveals atrial fibrillation with a heart rate of 90,  normal axis, no acute changes.   IMPRESSION:  1. Unstable angina pectoris. Cardiolite study positive for anterior      ischemia.  2. Good left ventricular function.  3. Atrial fibrillation/flutter.      a.     Coumadin therapy.      b.     Flecainide therapy.  4. Hypertension.  5. Hyperlipidemia.  6. Borderline diabetes mellitus.  7. Family history of coronary artery disease.  8. Sleep apnea on CPAP therapy.  9. History of bladder cancer status post resection.  10.History of depression.  11.Seasonal allergies.  12.Status post total abdominal hysterectomy/bilateral salpingo-      oophorectomy.  13.Status post cholecystectomy.  14.History of nephrolithiasis.   PLAN:  The patient presents to the office for followup on her Cardiolite  study and is noting chest discomfort that is concerning for unstable  angina pectoris. In light of her positive Cardiolite and positive risk  factors, we recommend direct admission to [facility] today. She  will need to be taken off of her Coumadin to be able to proceed with  cardiac catheterization. At this point in time, her electrocardiogram is  nonacute. She will be transported to [facility] via EMS. Once  her INR is less than 2, she will be placed on heparin. We will add low-  dose Lopressor at 12.5 mg twice a day as well as nitroglycerin drip. She  will be also placed on aspirin. We will check cardiac markers. We will  get the electrophysiology service to see the patient to help with  control with her atrial fibrillation/flutter. She may need her  flecainide therapy increased or changed to another antiarrhythmic agent.  Risks and benefits of cardiac catheterization have been explained to the patient, and she  agrees to proceed. She was also interviewed and  examined by Dr. Andee Lineman today.      Tereso Newcomer, PA-C  Electronically Signed      Learta Codding, MD,FACC  Electronically Signed   SW/MedQ  DD: 11/20/2006  DT: 11/20/2006  Job #: [id number]   cc:   Donzetta Sprung

## 2010-10-15 NOTE — Op Note (Signed)
[name], [name]               ACCOUNT NO.:  [id number]   MEDICAL RECORD NO.:  [id number]          PATIENT TYPE:  AMB   LOCATION:  DAY                          FACILITY:  [facility]   PHYSICIAN:  Excell Seltzer. Annabell Howells, M.D.    DATE OF BIRTH:  20-Nov-1955   DATE OF PROCEDURE:  09/07/2008  DATE OF DISCHARGE:                               OPERATIVE REPORT   PROCEDURES:  1. Cystoscopy with removal of left double-J stent.  2. Left ureteroscopy with holmium lasertripsy.  3. Left retrograde pyelogram.  4. Interpretation and insertion of left double-J stent.   PREOPERATIVE DIAGNOSIS:  Radiolucent left ureteropelvic junction stone.   POSTOPERATIVE DIAGNOSIS:  Radiolucent left ureteropelvic junction stone.   SURGEON:  Dr. Bjorn Pippin.   ANESTHESIA:  General.   SPECIMEN:  None.   DRAINS:  A 24 cm 6-French double-J stent with string.   COMPLICATIONS:  None.   INDICATIONS:  [name] is a 55 year old white female with a history of  stones, who recently presented with severe left lank pain and was found  to have an approximately 7 x 10 mm left UPJ stone.  She underwent  stenting as initial therapy because she was on Coumadin.  At the time of  stenting, she was found to have pyonephrosis.  She returns today for  definitive therapy and has been off her Coumadin.   FINDINGS AND PROCEDURE:  [name] was taken to the operating room where  she was given Cipro and fitted with PAS hose.  A general anesthetic was  induced.  She was placed in lithotomy position.  Her perineum and  genitalia were prepped with Betadine solution and she was draped in the  usual sterile fashion.  A timeout was performed.  Cystoscopy was  performed using a 22-French scope and 12 degrees lens and grasping  forceps.  The stent from her left ureteral orifice was identified and  brought to the outside where a guidewire was passed through the stent to  the kidney under fluoroscopic guidance.  This stent was then removed  leaving the  guidewire in place.  A 12/14 38-cm ureteral access sheath  was then placed to the kidney and the inner core was removed along with  the guidewire.   A 6-French digital ureteroscope was then passed per the access sheath to  the kidney and the collecting system was visualized.  No stone material  was noted in the upper or mid caliceal system.  However, the stone was  eventually identified in a lower pole calix.   Two different baskets were used to try to retrieve the stone.  However,  the stone was found to be quite friable, consisting probably of struvite  and was so soft that when a portion of the stone was placed in the  basket and the basket was closed, the basket would cut right through the  stone material.  Eventually after considerable effort, the stone was  reduced to small fragments using both the baskets and a 200 micron  holmium laser fiber set on 0.2 watts and 50 Hz.  Once the stone had been  reduced to small fragments, a few fragments and clots were removed with  both irrigation and with the basket.  At this point, the ureteroscope  was placed within the collecting system and contrast was instilled  allowing identification of all caliceal elements.  No evidence of  filling defects were noted in any of the calyces.  Visual inspection of  all of the calyces revealed no significant residual stone fragments.  At  this point, the ureteroscope was removed and a 5-French open-end  catheter was placed to the kidney and additional irrigation was  performed with return of some very fine grit.  A guidewire was then  passed through the access sheath to the kidney.  The access sheath was  removed and a 6-French 24 cm double-J stent with string was passed to  the kidney under fluoroscopic guidance.  The wire was removed leaving a  good coil in the kidney and a good coil in the bladder.  Repeat  cystoscopy revealed no significant fragments in the bladder.  The  bladder was drained.  The  scope was removed.  The stent string was left  exiting the urethra and was tied close to the meatus and trimmed.   The retrograde pyelogram performed through the scope demonstrated as  noted above the complete caliceal system without filling defects or  other abnormalities.   At this point, the patient was taken down from the lithotomy position.  Her anesthetic was reversed.  She was moved to the recovery room in  stable condition and there no complications.      Excell Seltzer. Annabell Howells, M.D.  Electronically Signed     JJW/MEDQ  D:  09/07/2008  T:  09/07/2008  Job:  [id number]   cc:   Donzetta Sprung  Fax: [phone]   Marcene Duos, MD  [facility]   Duke Salvia, MD, [facility]  [address]  [address]  [place]  [place] [address]

## 2010-10-15 NOTE — Assessment & Plan Note (Signed)
[facility]                          [facility]   [name], [name]                        MRN:          [id number]  DATE:12/03/2006                            DOB:          1955-12-11    REFERRING PHYSICIAN:  Donzetta Sprung   HISTORY OF PRESENT ILLNESS:  Patient is a 55 year old female with a  history of atrial fibrillation and flutter dating back to 2002.  The  patient was recently seen in the office and had an abnormal Cardiolite  stress study.  She was then set up for a cardiac catheterization.  The  catheterization essentially showed that she had nonobstructive coronary  artery disease.  She did have moderate plaquing of the proximal to mid  LAD.  There was also mild-to-moderate plaquing of the circumflex  coronary artery.  It was felt that she could be treated medically.   During the hospitalization, the patient was also seen by Dr. Graciela Husbands, and  the recommendation was given to discontinue flecainide and to refer her  for radiofrequency ablation.   Patient states that she is doing quite well.  She has no groin  complications.  She has no chest pain or shortness of breath.  She does  have exertional dyspnea and exertional palpitations, which relates to  her atrial fibrillation.   MEDICATIONS:  1. Prozac 20 mg p.o. daily.  2. Cardizem 240 mg p.o. daily.  3. Estrogen 2 mg p.o. daily.  4. Potassium 10 mEq p.o. b.i.d.  5. Vytorin 10/80 mg p.o. daily.  6. Diovan/hydrochlorothiazide 20/25 mg p.o. daily.  7. Allegra 180 daily.  8. Coumadin as directed.  9. Metoprolol 25 mg 1/2 tablet p.o. b.i.d.   PHYSICAL EXAMINATION:  VITAL SIGNS:  Blood pressure 132/86, heart rate  83 beats per minute, weight is 221 pounds.  NECK:  Normal carotid upstrokes.  No carotid bruits.  LUNGS:  Clear breath sounds bilaterally.  HEART:  Regular rate and rhythm.  Normal S1 and S2.  ABDOMEN:  Soft.  EXTREMITIES:  No edema.   PROBLEM LIST:  1.  Persistent atrial fibrillation.      a.     History of cardioversion x2 last April, 2008.      b.     Flecainide, discontinued by Dr. Graciela Husbands.      c.     Coumadin therapy, followed by Dr. Reuel Boom.  2. Abnormal Cardiolite study with nonobstructive coronary artery      disease by catheterization.  3. Hypertension.  4. Dyslipidemia.  5. Borderline diabetes mellitus.  6. Sleep apnea.   PLAN:  1. The patient was referred to Dr. Graciela Husbands for an appointment on December 14, 2006 to discuss the referral for radiofrequency ablation.  2. We will leave the patient off flecainide at this point in time, and      she can continue on Coumadin.  3. Risk factor modification for mild-to-moderate coronary artery      disease.     Learta Codding, MD,FACC  Electronically Signed    GED/MedQ  DD: 12/03/2006  DT: 12/03/2006  Job #: [id number]   cc:   Donzetta Sprung

## 2010-10-15 NOTE — Discharge Summary (Signed)
[name], [name] NO.:  [id number]   MEDICAL RECORD NO.:  [id number]          PATIENT TYPE:  OIB   LOCATION:  2899                         FACILITY:  [facility]   PHYSICIAN:  Arturo Morton. [name], MD, FACCDATE OF BIRTH:  1955-12-14   DATE OF ADMISSION:  01/13/2007  DATE OF DISCHARGE:  01/13/2007                               DISCHARGE SUMMARY   This is a dictation greater than 30 minutes.   ALLERGIES:  This patient has an allergy to ATENOLOL, which causes  fatigue.   FINAL DIAGNOSES:  1. Symptomatic atrial fibrillation (fluttering/fatigue).  2. Amiodarone LOAD started December 22, 2006.  3. DC CV to sinus rhythm January 13, 2007 under amiodarone therapy.  4. Continue Coumadin post cardioversion.  5. Stop Vytorin (incompatible with amiodarone).   SECONDARY DIAGNOSES:  1. Atrial fibrillation since 2002.  The patient had been on      flecainide.  2. Left heart catheterization in June 2008.  Moderate nonobstructive      coronary artery disease.      a.     Stop flecainide.  3. Tikosyn therapy not attempted secondary to prolonged QTc,  4. Ejection fraction of 58% at catheterization on December 23, 1998.  5. Hypertension.  6. Dyslipidemia.  7. Borderline diabetes.  8. Sleep apnea.  9. History of bladder tumor with mild hematuria.   PROCEDURE:  Direct current cardioversion January 13, 2007, Dr. Shawnie Pons, with restoration of sinus rhythm.  The patient maintaining  sinus rhythm at discharge.   NOTE:  The patient mildly hypokalemic on blood study this admission with  potassium of 3.6.  She does take 30 mEq of potassium a day but she is on  hydrochlorothiazide 25 mg daily, as well.   DISCHARGE MEDICATIONS:  1. Amiodarone 200 mg tablets, 1 tablet in the morning, 1 tablet in the      evening.  2. Cardizem XL 240 mg daily.  3. Allegra 180 mg daily.  4. Potassium chloride 20 mEq in the morning.  The patient could go up      to 20 mEq in the evening.  5. Diovan/HCTZ  320/25 one daily.  6. Estrogen 2 mg daily.  7. Coumadin as per [facility].  8. Glucosamine 1000 mg daily.  9. Continue vitamin C and vitamin E.  10.Stop Vytorin (incompatible with amiodarone).   FOLLOWUP:  At [facility], the [facility] office.  To see Dr. Graciela Husbands  Friday, January 15, 2007 at 1:30.  At this time, CONSIDER:  1. Schedule follow up liver function studies for the first week of      September 2008.  This is 6 weeks after beginning amiodarone.  2. Replace Vytorin with another statin drug.   BRIEF HISTORY:  [name] is a 55 year old female.  She has a history  of atrial fibrillation dating back to 2002.  She is symptomatic in  atrial fibrillation with fluttering sensation and fatigue.  She has had  two cardioversions in the past; the first lasted quite some time, the  second one only lasted 2 days before relapse.  Her  last cardioversion  was April 2008.  At that time she was placed on flecainide.   She became a [facility] patient in June 2008 when she reported  substernal chest pain.  A Myoview study was mildly abnormal.  She had a  left heart catheterization on November 23, 2006.  She was in atrial  fibrillation during the study.  The study showed that ejection fraction  was preserved.  There was 30-40% lesion in the mid LAD.  The diagonal  has a 70% stenosis.  The first obtuse marginal had a 30-40% stenosis and  the takeoff of the PDA had a 30% stenosis.  Her flecainide was  discontinued because of mild coronary artery disease.  She was  discharged on metoprolol, but that apparently caused weight gain and  fatigue, as well.   She was seen by Dr. Andee Lineman on December 18, 2006 and set up for inpatient  Tikosyn after consulting with Dr. Graciela Husbands.  The patient presented on December 21, 2006.  It was found that her QT was prolonged.  Her QTc was 470.  The patient never did receive Tikosyn but was discharged on amiodarone  therapy to return for cardioversion after 4 weeks of  therapeutic  Coumadin as determined by the [facility].  She presents  now, January 13, 2007, for cardioversion.  Dr. [name] will attend.   HOSPITAL COURSE:  The patient presents electively on January 13, 2007.  Her Coumadin has been with therapeutic INR since her discharge on December 22, 2006.  She underwent direct current cardioversion after conscious  sedation.  She converted from atrial fibrillation, controlled  ventricular rate, to sinus rhythm, and will discharge the same day.  Her  Vytorin has been stopped by Dr. [name], as it is incompatible with  amiodarone.  Her potassium was 3.6 despite the fact that she does take  potassium replacement.  The patient could easily go to 20 mEq twice  daily.   RESULTS TODAY:  Serum electrolytes - sodium 139, potassium 3.6, chloride  105, carbonate 26, glucose 110, BUN is 11, creatinine 0.51.   Dictating thank you      [name], [name]      Arturo Morton. [name], MD, [facility]  Electronically Signed    GM/MEDQ  D:  01/13/2007  T:  01/14/2007  Job:  [id number]   cc:   Learta Codding, MD,FACC  Donzetta Sprung

## 2010-10-15 NOTE — Letter (Signed)
January 15, 2007    Mick Sell, M.D.  [facility]  Department of Cardiology  Division of Electrophysiology  [facility] [place], [place] [address]   RE:  RIANNE, DEGRAAF  MRN:  [id number]  /  DOB:  June 08, 1955   Dear Onalee Hua:   It was great talking to you the other day and I have taken care of  getting the previous gentleman set up with the CT surgeons.   This letter is to introduce to you Rolla Plate.  She is a 54 year old  woman with recurrent atrial fibrillation.  She has had modest  nonobstructive coronary artery disease with 30-50% lesions in the  setting of normal left ventricular function.  She has thromboembolic  risk factors notable for diabetes and hypertension and severely  symptomatic atrial fibrillation.  Her electrocardiogram had modest  prolongation of her QT interval, so she was initially referred to me for  initiation of Tikosyn; we ended up having to skip that and put her on  amiodarone.  After 3 weeks of amiodarone, she underwent cardioversion,  which was successful, but within 48 hours she had reverted to atrial  fibrillation.   It is my plan at this point to increase her amiodarone and re-cardiovert  her in about 10 days given the significant symptoms.  She would like to  see you for consideration of pulmonary vein isolation for her atrial  fibrillation.   Please see the accompanying records.    Sincerely,      Duke Salvia, MD, [facility]  Electronically Signed    SCK/MedQ  DD: 01/15/2007  DT: 01/16/2007  Job #: [id number]

## 2010-10-15 NOTE — Cardiovascular Report (Signed)
[name], [name] NO.:  [id number]   MEDICAL RECORD NO.:  [id number]          PATIENT TYPE:  OIB   LOCATION:  2899                         FACILITY:  [facility]   PHYSICIAN:  Arturo Morton. [name], MD, FACCDATE OF BIRTH:  01-05-1956   DATE OF PROCEDURE:  01/13/2007  DATE OF DISCHARGE:  01/13/2007                            CARDIAC CATHETERIZATION   PROCEDURE:  Cardioversion   HISTORY:  [name] has recurrent atrial arrhythmias.  She was brought  in today for cardioversion.  She has documented INRs in excess of 2 for  more than a month.  Informed consent was obtained earlier by Dr. Graciela Husbands.  I reviewed the procedure with the patient.   ANESTHESIA:  Performed by Janetta Hora. Gelene Mink, M.D.  She was given 350  mg of sodium Pentothal, 100 watt seconds of energy was utilized to  convert her with the first shock to sinus rhythm.  She tolerated the  procedure well.  There were no major complications.   CONCLUSIONS:  1. Successful cardioversion to normal sinus rhythm.  The patient is on      amiodarone.  She is also on Vytorin. We will recommend that her      Vytorin be discontinued since she is on amiodarone and replaced by      an alternative lipid-lowering agent.   ADDENDUM:  Follow up will be with Dr. Graciela Husbands.      Arturo Morton. [name], MD, [facility]  Electronically Signed     TDS/MEDQ  D:  01/13/2007  T:  01/14/2007  Job:  [id number]   cc:   Veatrice Kells, MD, [facility]

## 2010-10-15 NOTE — Consult Note (Signed)
[name], [name] NO.:  [id number]   MEDICAL RECORD NO.:  [id number]          PATIENT TYPE:  INP   LOCATION:  3738                         FACILITY:  [facility]   PHYSICIAN:  Duke Salvia, MD, FACCDATE OF BIRTH:  [birth year]   DATE OF CONSULTATION:  11/20/2006  DATE OF DISCHARGE:                                 CONSULTATION   Thank you much for asking [place] to see [name] in consultation for  atrial arrhythmia.   [name] is a 55 year old woman who was followed by Dr. Cinda Quest  over at [facility] since 2003 for paroxysmal atrial fibrillation.  Since  Dr.  Erskine Speed retirement, she has been seen by the PA there.  This had  become less satisfactory that seeing by Dr. Cloyde Reams.   Because of that, she was referred by Dr. Reuel Boom to Dr. Andee Lineman and his  team at [facility].  This is for further evaluation of the above.   Her atrial fibrillation story is notable for having been cardioverted in  2003 and put on flecainide at 100 mg twice daily at that time.  She had  short  paroxysms of atrial fibrillation, but then became persistent in  the early part of 2008.  She was initiated on Coumadin and ultimately in  April underwent DC cardioversion with restoration of sinus rhythm.  However, she only held it for about 48 hours.  In the wake of that, and  not getting the response that she had hoped for, she sought Dr. Rosann Auerbach  assistance.   When she was seen by Dr. Margarita Mail team in the person of Tereso Newcomer, he  was concerned and undertook a Holter monitor which showed a variable  ventricular response with a mean rate of about 80 and a maximum rate of  170 and a minimum rate of 54.  Myoview scan was undertaken and  demonstrated normal left ventricular function, but an anterior wall  motion abnormality in the LAD distribution.  Because of that, referred  for catheterization which was to be as outpatient, but she developed  intermittent chest discomfort in the wake of that and  was referred thus  for admission.   Her thromboembolic risk factors are notable for  A.  Hypertension.  B.  Borderline diabetes.  C.  Gender.   Her cardiac risk factors are notable for:  A.  Abdominal obesity.  B.  Hypertension.  C.  Borderline diabetes.  E.  Dyslipidemia.   She also has a strong family history of coronary artery disease.   PAST MEDICAL HISTORY:  1. 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  2. Cholecystectomy.  3. Hysterectomy.   REVIEW OF SYSTEMS:  As noted on the intake sheet, dated today, and in  addition to the above is notable for anxiety and depression.  Otherwise,  it is broadly negative apart from allergies and nephrolithiasis as noted  previously.   MEDICATIONS AS AN OUTPATIENT:  1. Flecainide 100 b.i.d.  2. Cardizem 240.  3. Estrogen.  4. Potassium.  5. Vytorin.  6. Diovan.  7. HCT 320/25.  8. Allegra.  9. Metoprolol was started at some point, I am not quite sure when.   ALLERGIES:  She does not have any allergies, but atenolol makes her  fatigued.   SOCIAL HISTORY:  Does not use cigarettes, alcohol or recreational drugs.  She is married and has no children.  She works at [employer] records at  [employer].   PHYSICAL EXAMINATION:  GENERAL:  She is middle-aged Caucasian female  appearing her stated age of 55.  Her blood pressure is 139/87.  Her  pulse 86 irregular, respirations were 22.  HEENT:  Demonstrated no icterus or xanthoma.  NECK:  Veins were flat.  Carotids were brisk and full bilaterally  without bruits.  BACK:  Without kyphosis or scoliosis.  Lungs were clear.  HEART:  Sounds were irregular without murmurs or gallops.  ABDOMEN:  Protuberant and soft with active bowel sounds.  Femoral pulses  were not examined.  Distal pulses were intact.  There was no clubbing,  cyanosis or edema.  NEUROLOGICAL:  Exam was grossly normal.  SKIN:  Warm and dry.   STUDIES:  Electrocardiogram dated today  demonstrated atrial fibrillation  that was coarse with a ventricular response of 90 with intervals of  0.09/0.37.   Electrocardiogram dated March 12 demonstrated atrial fibrillation also,  and there is another electrocardiogram that I cannot find right now,  also from today, that demonstrated atrial fibrillation.   Initial cardiac enzymes were negative.  Potassium was noted today to be  3.3.  Her blood sugar was 120.  INR was 1.8.   IMPRESSION:  1. Atrial fibrillation, paroxysmal, now persistent times 5 years with      a history of atrial flutter as noted by the PA at [facility] but not .  2. Thromboembolic risk factors notable for hypertension, borderline      diabetes and gender.  3. Anxiety disorder.  4. Abnormal Myoview with anterior profusion defect.  5. Metabolic syndrome.      a.     Hypertension.      b.     Abdominal obesity.      c.     Borderline diabetes.      d.     Dyslipidemia.      e.     And also with a strong family history of coronary disease.   [name] has atrial fibrillation with an abnormal Myoview and strong  risk factors for coronary artery disease.  I concur with  catheterization.  I suspect that 1C therapy will be precluded.   The next issue will be do we pursue a strategy of rate control or rhythm  control for atrial fibrillation.  Given her young age and her  thromboembolic risk factors, I would favor rhythm control with the hopes  of being able to accomplish a successful pulmonary vacillation with Dr.  Jarrett Ables help which we could potentially enlist.   Intensive risk factor modification for coronary disease is also  essential.  This will likely require statin therapy as well as other  risk reduction like weight loss, etc.   Based on the above, we would recommend:  1. Agree with catheterization.  2. Will need to wait for the above to choose anterior good drug     therapy, though I suspect a 1C therapy will be precluded.  3. Favor long-term  Coumadin with her [place] score of 1+.  4. __________ Wynona Neat for pulmonary vein isolation.      Duke Salvia, MD, [facility]  Electronically Signed     SCK/MEDQ  D:  11/20/2006  T:  11/21/2006  Job:  [id number]   cc:   Manus Rudd Dr. Clydie Braun

## 2010-10-18 NOTE — Procedures (Signed)
[place]. [facility]  Patient:    [name], [name]                        MRN: [id number] Proc. Date: 09/28/00 Adm. Date:  [id number] Attending:  Nelda Marseille CC:         Almond Lint in Earlean Polka, M.D.   Procedure Report  PROCEDURE PERFORMED:  Colonoscopy with polypectomy.  ENDOSCOPIST:  Petra Kuba, M.D.  INDICATIONS FOR PROCEDURE:  Patient with bright red blood per rectum, probably due to hemorrhoids, want to rule out any other etiology.  Consent was signed after risks, benefits, methods, and options were thoroughly discussed in the office.  MEDICATIONS USED:  Demerol 75 mg, Versed 7.5 mg.  DESCRIPTION OF PROCEDURE:  Rectal inspection was pertinent for external hemorrhoids.  Digital exam was negative. Video colonoscope was inserted and easily advanced around the colon to the cecum.  This did require some abdominal pressure but no position changes.  No obvious abnormality was seen on insertion.  The cecum was identified by the appendiceal orifice and the ileocecal valve.  In fact the scope was inserted a short ways into the terminal ileum which was normal.  Photodocumentation was obtained and the prep was adequate.  There was some liquid stool that required washing and suctioning.  On slow withdrawal through the colon the cecum and the ascending were normal.  In the midtransverse, a small 4 mm polyp was seen and snared and electrocautery applied. The polyp was suctioned through the scope and collected in the trap. The scope was further withdrawn.  No other abnormalities were seen as we slowly drew back to the distal sigmoid where a small 1 to 2 mm polyp was seen and was hot biopsied x 1 and put in a separate container.  The scope was further withdrawn back to the rectum and retroflexed pertinent for some tiny internal hemorrhoids.  The scope was straightened and readvanced a short ways up the sigmoid, air was suctioned,  scope removed.  The patient tolerated the procedure well.  There was no obvious immediate complication.  ENDOSCOPIC DIAGNOSIS: 1. External greater than internal hemorrhoids. 2. Two small polyps, one hot biopsied in the sigmloid and one snared in the    transverse. 3. Otherwise within normal limits to the terminal ileum.  PLAN:  Customary postpolypectomy instructions.  Return care to  Dr. Luan Pulling and Dr. Reuel Boom for customary health care maintenance and a decision about any further more aggressive hemorrhoidal surgeries and otherwise await pathology to determine future colonic screening.  Happy to see back sooner p.r.n. DD:  09/28/00 TD:  09/28/00 Job: [id number]

## 2010-10-18 NOTE — Op Note (Signed)
[name], [name]               ACCOUNT NO.:  [id number]   MEDICAL RECORD NO.:  [id number]          PATIENT TYPE:  AMB   LOCATION:  [facility]                         FACILITY:  [facility]   PHYSICIAN:  Excell Seltzer. Annabell Howells, M.D.    DATE OF BIRTH:  1956/02/05   DATE OF PROCEDURE:  08/29/2004  DATE OF DISCHARGE:                                 OPERATIVE REPORT   PROCEDURES:  1.  Cystoscopy.  2.  Bilateral retrograde pyelograms with interpretation.  3.  Resection of 1-cm bladder tumor.  4.  Left ureteroscopy.   PREOPERATIVE DIAGNOSES:  1.  Hematuria.  2.  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   POSTOPERATIVE DIAGNOSIS:  Hematuria with small bladder tumor at the right  bladder neck.   ANESTHESIA:  General.   SURGEON:  Excell Seltzer. Annabell Howells, M.D.   SPECIMENS:  Biopsy of tumor from right bladder neck.   COMPLICATIONS:  None.   INDICATION:  [name] is a 54 year old white female who was referred for  hematuria.  CT demonstrated a questionable filling defect in the upper pole  of the left kidney. It was felt that cystoretrograde pyelography and  ureteroscopy with possible biopsy were indicated.   FINDINGS:  The patient was given p.o. Cipro.  She was taken to the operating  room where general anesthetic was induced.  She was placed in the lithotomy  position.  Her perineum and genitalia were prepped with Betadine solution.  She was draped in the usual sterile fashion.  A B&O suppository was placed  prior to prep.   Cystoscopy was performed with a 22-French scope and the 12 and 70 degree  lenses.  Examination revealed a normal urethra.  At the right bladder neck  was a 1-cm papillary lesion suspicious for transitional cell carcinoma.  It  appeared low grade and superficial.  The remainder of the bladder wall had  mild trabeculation but no mucosal lesions.  The ureteral orifices were  unremarkable.   After completion of the initial cystoscopy, a cupped biopsy instruction was  used to remove the bladder  tumor.  The tumor bed was then fulgurated with a  Bugbee electrode.   Retrograde pyelograms were then performed.  Initially the right ureter was  cannulated with a 5-French open-ended catheter.  Contrast was instilled in  the retrograde fashion.  Examination revealed a normal ureter and intrarenal  collecting system without filling defects.   The left retrograde pyelogram was then performed, once again with a 5-French  open-ended catheter.  Contrast was instilled.  Examination revealed a normal  ureter.  The intrarenal collecting system had no obvious filling defects.  There did appear to be a rather broad papilla at the upper pole which was  felt to possibly be consistent with the filling defect seen on CT.  However,  I felt ureteroscopy was indicated for thorough evaluation.   A guidewire was placed up the right ureteral orifice to just below the  kidney.  A dilating sheath from an access kit was then passed over the wire  to the kidney.  This sheath was 12-French in size.  The  access sheath was  removed and the 6-French flexible ureteroscope was then inserted over the  wire to the proximal ureter.  The wire was then removed and the intrarenal  collecting system was thoroughly inspected using fluoroscopy to ensure that  all areas of the kidney had been properly visualized.  The area of interest  in the upper pole contained a broad-based papilla within a large calyx but  no papillary necrosis or tumor were identified in the upper pole or any of  the other calices including the mid and lower pole, which were inspected  thoroughly.  After a complete inspection, the kidney revealed no  abnormalities.  The ureteroscope was removed.  There was minimal apparent  trauma to the ureter and it was felt stenting was not necessary.   At this point, the patient's bladder was drained with the cystoscope.  She  was taken down from the lithotomy position.  Her anesthetic was reversed.  She was moved  to the recovery room in stable condition.  There were no  complications.      JJW/MEDQ  D:  08/29/2004  T:  08/29/2004  Job:  [id number]   cc:   Dr. Garner Nash, Jonita Albee   Dr. Cloyde Reams, [facility] Dept of Cardiology   Duke Salvia. Marcelle Overlie, M.D.  [address], [address] [place]  [place] [address]  Fax: [phone]

## 2010-11-21 ENCOUNTER — Encounter (INDEPENDENT_AMBULATORY_CARE_PROVIDER_SITE_OTHER): Payer: Self-pay | Admitting: General Surgery

## 2011-03-13 LAB — HEPATIC FUNCTION PANEL
Bilirubin, Direct: 0.2
Indirect Bilirubin: 0.5
Total Bilirubin: 0.7

## 2011-03-13 LAB — TSH: TSH: 3.232

## 2011-03-17 LAB — BASIC METABOLIC PANEL
BUN: 8
BUN: 9
CO2: 26
CO2: 29
Calcium: 8.8
Calcium: 8.6
Calcium: 8.6
Chloride: 105
Creatinine, Ser: 0.51
Creatinine, Ser: 0.53
Creatinine, Ser: 0.78
GFR calc Af Amer: >60
GFR calc Af Amer: >60
GFR calc Af Amer: >60
GFR calc non Af Amer: >60
GFR calc non Af Amer: >60
GFR calc non Af Amer: >60
GFR calc non Af Amer: >60
Potassium: 3.5
Sodium: 140
Sodium: 144

## 2011-03-17 LAB — MAGNESIUM: Magnesium: 1.8

## 2011-03-17 LAB — HEPATIC FUNCTION PANEL
ALT: 15
Bilirubin, Direct: <0.1
Total Protein: 6.2

## 2011-03-17 LAB — APTT
aPTT: 40 — ABNORMAL HIGH
aPTT: 39 — ABNORMAL HIGH

## 2011-03-17 LAB — CBC
MCHC: 34
RBC: 4.45
WBC: 8.2

## 2011-03-17 LAB — PROTIME-INR
INR: 2.4 — ABNORMAL HIGH
INR: 2.3 — ABNORMAL HIGH
INR: 2.5 — ABNORMAL HIGH
Prothrombin Time: 27.3 — ABNORMAL HIGH
Prothrombin Time: 26.1 — ABNORMAL HIGH

## 2011-03-19 LAB — CBC
HCT: 38.9
HCT: 39.4
HCT: 40.7
HCT: 41.1
HCT: 41.3
Hemoglobin: 13.1
Hemoglobin: 13.5
Hemoglobin: 13.8
Hemoglobin: 14
MCHC: 34.2
MCV: 87
Platelets: 216
Platelets: 232
Platelets: 255
Platelets: 264
Platelets: 267
RBC: 4.54
RBC: 4.66
RBC: 4.73
RDW: 13.8
RDW: 13.8
RDW: 13.9
WBC: 10
WBC: 10.4
WBC: 10.6 — ABNORMAL HIGH
WBC: 10.9 — ABNORMAL HIGH
WBC: 11.1 — ABNORMAL HIGH

## 2011-03-19 LAB — CARDIAC PANEL(CRET KIN+CKTOT+MB+TROPI)
CK, MB: 0.8
Troponin I: 0.01
Troponin I: 0.02

## 2011-03-19 LAB — COMPREHENSIVE METABOLIC PANEL
Alkaline Phosphatase: 60
BUN: 8
Chloride: 106
Creatinine, Ser: 0.56
GFR calc non Af Amer: >60
Glucose, Bld: 120 — ABNORMAL HIGH
Potassium: 3.3 — ABNORMAL LOW
Total Bilirubin: 0.9

## 2011-03-19 LAB — PROTIME-INR
INR: 1.1
INR: 1.5
INR: 1.7 — ABNORMAL HIGH
INR: 1.8 — ABNORMAL HIGH
Prothrombin Time: 13.4
Prothrombin Time: 14.1
Prothrombin Time: 18.2 — ABNORMAL HIGH
Prothrombin Time: 21.9 — ABNORMAL HIGH

## 2011-03-19 LAB — BASIC METABOLIC PANEL
BUN: 11
BUN: 14
Calcium: 8.6
Chloride: 105
Creatinine, Ser: 0.54
Creatinine, Ser: 0.55
GFR calc Af Amer: >60
GFR calc non Af Amer: >60
Potassium: 3.9

## 2011-03-19 LAB — LIPID PANEL
HDL: 40
LDL Cholesterol: 75
Triglycerides: 220 — ABNORMAL HIGH
VLDL: 44 — ABNORMAL HIGH

## 2011-03-19 LAB — URINE MICROSCOPIC-ADD ON

## 2011-03-19 LAB — URINALYSIS, ROUTINE W REFLEX MICROSCOPIC
Bilirubin Urine: NEGATIVE
Ketones, ur: NEGATIVE
Leukocytes, UA: NEGATIVE
Nitrite: NEGATIVE
Specific Gravity, Urine: 1.011
Urobilinogen, UA: 0.2
pH: 8

## 2011-03-19 LAB — HEMOGLOBIN A1C: Hgb A1c MFr Bld: 6.9 — ABNORMAL HIGH

## 2011-03-19 LAB — APTT: aPTT: 35

## 2011-07-16 ENCOUNTER — Other Ambulatory Visit: Payer: Self-pay | Admitting: Obstetrics and Gynecology

## 2011-07-16 DIAGNOSIS — N6310 Unspecified lump in the right breast, unspecified quadrant: Secondary | ICD-10-CM

## 2011-07-24 ENCOUNTER — Other Ambulatory Visit: Payer: BC Managed Care – PPO

## 2011-07-24 ENCOUNTER — Ambulatory Visit
Admission: RE | Admit: 2011-07-24 | Discharge: 2011-07-24 | Disposition: A | Discharge status: Home / Self Care | Payer: BC Managed Care – PPO | Source: Ambulatory Visit | Attending: Obstetrics and Gynecology | Admitting: Obstetrics and Gynecology

## 2011-07-24 ENCOUNTER — Other Ambulatory Visit: Payer: Self-pay | Admitting: Obstetrics and Gynecology

## 2011-07-24 DIAGNOSIS — N6310 Unspecified lump in the right breast, unspecified quadrant: Secondary | ICD-10-CM

## 2012-01-02 ENCOUNTER — Other Ambulatory Visit: Payer: Self-pay | Admitting: Obstetrics and Gynecology

## 2012-01-02 DIAGNOSIS — Z1231 Encounter for screening mammogram for malignant neoplasm of breast: Secondary | ICD-10-CM

## 2012-01-16 ENCOUNTER — Ambulatory Visit
Admission: RE | Admit: 2012-01-16 | Discharge: 2012-01-16 | Disposition: A | Discharge status: Home / Self Care | Payer: BC Managed Care – PPO | Source: Ambulatory Visit | Attending: Obstetrics and Gynecology | Admitting: Obstetrics and Gynecology

## 2012-01-16 DIAGNOSIS — Z1231 Encounter for screening mammogram for malignant neoplasm of breast: Secondary | ICD-10-CM

## 2012-04-05 ENCOUNTER — Telehealth (INDEPENDENT_AMBULATORY_CARE_PROVIDER_SITE_OTHER): Payer: Self-pay | Admitting: General Surgery

## 2012-04-05 NOTE — Telephone Encounter (Signed)
LMOM explaining to pt that we are having to reschedule her appt back to Friday 11/8 at 8:30.

## 2012-04-06 ENCOUNTER — Ambulatory Visit (INDEPENDENT_AMBULATORY_CARE_PROVIDER_SITE_OTHER): Payer: BC Managed Care – PPO | Admitting: General Surgery

## 2012-04-09 ENCOUNTER — Ambulatory Visit (INDEPENDENT_AMBULATORY_CARE_PROVIDER_SITE_OTHER): Payer: BC Managed Care – PPO | Admitting: General Surgery

## 2012-04-09 ENCOUNTER — Encounter (INDEPENDENT_AMBULATORY_CARE_PROVIDER_SITE_OTHER): Payer: Self-pay | Admitting: General Surgery

## 2012-04-09 ENCOUNTER — Telehealth (INDEPENDENT_AMBULATORY_CARE_PROVIDER_SITE_OTHER): Payer: Self-pay | Admitting: General Surgery

## 2012-04-09 VITALS — BP 142/80 | HR 74 | Temp 98.0°F | Resp 18 | Ht 62.0 in | Wt 214.2 lb

## 2012-04-09 DIAGNOSIS — N63 Unspecified lump in unspecified breast: Secondary | ICD-10-CM

## 2012-04-09 NOTE — Telephone Encounter (Signed)
Called Bonita Quin back and she informed me that Dr. Reuel Boom wants this pt to undergo a Lovenox bridge before she can come off her Xarelto.  I informed her that I would let Dr. Dwain Sarna know, and as soon as he gives me the "okay" I will let her know so they can facilitate this for the patient.

## 2012-04-09 NOTE — Progress Notes (Signed)
Patient ID: [name], female   DOB: Nov 17, 1955, 56 y.o.   MRN: [id number]  Chief Complaint  Patient presents with  . New Evaluation    Hematoma - breast    HPI [name] is a 56 y.o. female.  Referred by Dr. Kelly Splinter HPI This is a 55 year old female who presents with a history of a left breast mass. In 2009 she had a fall while she was on Coumadin and had a lot of bruising over her entire left side. In the left breast is eventually coalesced into what appears to be a organized left breast subareolar hematoma. This area has never resolved since then has remained hardened it is somewhat tender also. She has undergone mammograms and ultrasound showed that this area does appear to be benign. She was Ashy seen in our office in 2012 to discuss excision which he decided not to due to the concern that this would cause a deformity per her. She is still concerned about this area as it is not going away. She then made an appointment to see Dr. Kelly Splinter to discuss this. She discuss with them a breast reduction and she was also sent over here to see me. Again she reports that this area is tender. She has no history of breast problems herself or any family history of breast or ovarian cancer. She does have a history of atrial fibrillation which he has undergone ablation now she has proximal atrial fibrillation. She is on antiplatelet therapy for this which she will need to come off. Past Medical History  Diagnosis Date  . Atrial fibrillation   . Hypertension   . Depression   . Diabetes mellitus without complication   . Hyperlipidemia     Past Surgical History  Procedure Date  . Cardiac electrophysiology mapping and ablation   . Abdominal hysterectomy 1999  . Cholecystectomy 2004 or 2005  . Bladder tumor excision 2006    due to cancer - no further treatment was required.    Family History  Problem Relation Age of Onset  . Cancer Mother 2    colon  . Heart disease Father   . Stroke Father      Social History History  Substance Use Topics  . Smoking status: Never Smoker   . Smokeless tobacco: Never Used  . Alcohol Use: No    No Known Allergies  Current Outpatient Prescriptions  Medication Sig Dispense Refill  . aspirin 81 MG tablet Take 81 mg by mouth daily.      Marland Kitchen estradiol (ESTRACE) 1 MG tablet Take 1 mg by mouth daily.      Marland Kitchen Fexofenadine HCl (ALLEGRA PO) Take by mouth daily.      . isosorbide mononitrate (IMDUR) 30 MG 24 hr tablet Take 30 mg by mouth daily.      . metFORMIN (GLUCOPHAGE) 500 MG tablet Take 500 mg by mouth 2 (two) times daily with a meal.      . Multiple Vitamin (MULTIVITAMIN) tablet Take 1 tablet by mouth daily.      . Rivaroxaban (XARELTO PO) Take by mouth daily.      Marland Kitchen FLUoxetine (PROZAC) 20 MG capsule Take 20 mg by mouth daily.        . metoprolol succinate (TOPROL-XL) 25 MG 24 hr tablet Take 25 mg by mouth daily.        . potassium chloride SA (K-DUR,KLOR-CON) 20 MEQ tablet Take 20 mEq by mouth 2 (two) times daily.        Marland Kitchen  rosuvastatin (CRESTOR) 20 MG tablet Take 20 mg by mouth daily.        . valsartan-hydrochlorothiazide (DIOVAN-HCT) 320-25 MG per tablet Take 1 tablet by mouth daily.          Review of Systems Review of Systems  Constitutional: Negative for fever, chills and unexpected weight change.  HENT: Negative for hearing loss, congestion, sore throat, trouble swallowing and voice change.   Eyes: Negative for visual disturbance.  Respiratory: Negative for cough and wheezing.   Cardiovascular: Negative for chest pain, palpitations and leg swelling.  Gastrointestinal: Negative for nausea, vomiting, abdominal pain, diarrhea, constipation, blood in stool, abdominal distention and anal bleeding.  Genitourinary: Negative for hematuria, vaginal bleeding and difficulty urinating.  Musculoskeletal: Negative for arthralgias.  Skin: Negative for rash and wound.  Neurological: Negative for seizures, syncope and headaches.  Hematological:  Negative for adenopathy. Does not bruise/bleed easily.  Psychiatric/Behavioral: Negative for confusion.    Blood pressure 142/80, pulse 74, temperature 98 F (36.7 C), temperature source Temporal, resp. rate 18, height 5\' 2"  (1.575 m), weight 214 lb 4 oz (97.183 kg).  Physical Exam Physical Exam  Vitals reviewed. Constitutional: She appears well-developed and well-nourished.  Cardiovascular: Normal rate, regular rhythm and normal heart sounds.   Pulmonary/Chest: Effort normal and breath sounds normal. She has no wheezes. She has no rales. Right breast exhibits no inverted nipple, no mass, no nipple discharge, no skin change and no tenderness. Left breast exhibits mass and tenderness. Left breast exhibits no inverted nipple, no nipple discharge and no skin change. Breasts are symmetrical.    Lymphadenopathy:    She has no cervical adenopathy.    She has no axillary adenopathy.       Right: No supraclavicular adenopathy present.       Left: No supraclavicular adenopathy present.    Data Reviewed DIGITAL BILATERAL SCREENING MAMMOGRAM WITH CAD  Comparison: Previous exams.  Findings: The breast tissue is almost entirely fatty. No suspicious  masses, architectural distortion, or calcifications are present. A  previously demonstrated anterior left breast hematoma is smaller  and more lucent.  Images were processed with CAD.  IMPRESSION:  No mammographic evidence of malignancy.  A result letter of this screening mammogram will be mailed directly  to the patient.  RECOMMENDATION:  Screening mammogram in one year. (Code:SM-B-01Y)  BI-RADS CATEGORY 2: Benign finding(s).    Assessment    Left breast mass, likely hematoma    Plan    This area appears to be benign in nature although if you examined herone time it would be somewhat concerning. Historically and radiologically this appears to be an organized hematoma. I do think it is reasonable to excise this area. She would very much  like to have this area excised. We will need to discuss getting her off her antiplatelet therapy around the time of the surgery. We discussed 2 options today. One would be me removing this area. I think this would cause some changes to her left breast and would not be symmetrical with the right breast although I do not think there would be a concavity associated with this. I think therefore the best cosmetic and functional result basically doing a reduction on the left side and a symmetry procedure on the right side would be the best plan for this. Obviously would send this to the pathologist after we removed it as well. I think doing this in conjunction with Dr. Kelly Splinter would be the best plan. We discussed the risks of surgery  including bleeding, infection, difficulty healing, and if there is any cancer associated with this we would need to do further procedures. We'll plan on getting in touch with her primary care physician as well as Dr. Kelly Splinter to coordinate.       Erving Sassano 04/09/2012, 9:23 AM

## 2012-04-09 NOTE — Telephone Encounter (Signed)
Message copied by Littie Deeds on Fri Apr 09, 2012  1:25 PM ------      Message from: Wilder Glade      Created: Fri Apr 09, 2012 12:51 PM       Please call and ask for Bonita Quin who is Dr Donzetta Sprung nurse she is calling about stopping this pt blood thinner, she need to talk to you. Her call back number is [phone] and have her paged, and she stated that they closed at 4 today. Pattricia Boss

## 2012-04-19 ENCOUNTER — Telehealth (INDEPENDENT_AMBULATORY_CARE_PROVIDER_SITE_OTHER): Payer: Self-pay | Admitting: General Surgery

## 2012-04-19 NOTE — Telephone Encounter (Signed)
Spoke with Dr. Leonie Green office letting them know that Dr. Dwain Sarna wants to stop this pt's xarelto 5 days prior to surgery as well as 2 weeks post surgery.  Informed them that we received clearance from Dr. Reuel Boom but that he wants to do a Lovenox bridge.  I wanted to make sure that Dr. Kelly Splinter was okay with this as well since she is Assisting in the surgery.  They informed me that they would let me know what she says.

## 2012-04-21 ENCOUNTER — Telehealth (INDEPENDENT_AMBULATORY_CARE_PROVIDER_SITE_OTHER): Payer: Self-pay | Admitting: General Surgery

## 2012-04-21 NOTE — Telephone Encounter (Signed)
Message copied by Littie Deeds on Wed Apr 21, 2012  8:28 AM ------      Message from: Leanne Chang      Created: Tue Apr 20, 2012 12:17 PM      Regarding: Dr Jeananne Rama: [phone]       Would like to speak with you about her sx. Have questions and concerns

## 2012-04-21 NOTE — Telephone Encounter (Signed)
°  Dr. Kelly Splinter called to let us know that she is okay with this pt doing the Lovenox bridge.  I gave her the heads up that the patient is still not sure of how much surgery she wants to do and that she will call and let us know when she has made up her mind.  She said this was okay.

## 2012-04-21 NOTE — Telephone Encounter (Signed)
Returned patient's call and she informed me that she spoke with Dr. Kelly Splinter yesterday and now she is second guessing whether or not to do the excision of the hematoma and the breast reduction.  She is now leaning more towards just removing the breast hematoma.  I informed her that she should take some time to think about this and in the process of that I will continue to get the okay from Dr. Kelly Splinter on the Lovenox bridge just in case she decides to do the whole thing.  I informed her to call me back when she has decided what she would like to do and we will schedule her then.

## 2012-05-12 ENCOUNTER — Encounter (HOSPITAL_COMMUNITY): Payer: Self-pay | Admitting: Pharmacy Technician

## 2012-05-14 ENCOUNTER — Other Ambulatory Visit (INDEPENDENT_AMBULATORY_CARE_PROVIDER_SITE_OTHER): Payer: Self-pay | Admitting: General Surgery

## 2012-05-17 NOTE — Pre-Procedure Instructions (Signed)
21 [name]  05/17/2012   Your procedure is scheduled on:  Monday, December 23rd.  Report to [facility] at 5:30 AM.  Call this number if you have problems the morning of surgery: [phone]   Remember:Nothing to eat or drink after Midnight.    Take these medicines the morning of surgery with A SIP OF WATER: Fexofenadine (allegra), Fluoxetine (Prozac), Isosorbide (Imdur), Metoprolol (Toprol XL).    Do not wear jewelry, make-up or nail polish.  Do not wear lotions, powders, or perfumes. You may wear deodorant.  Do not shave 48 hours prior to surgery. Men may shave face and neck.  Do not bring valuables to the hospital.  Contacts, dentures or bridgework may not be worn into surgery.  Leave suitcase in the car. After surgery it may be brought to your room.  For patients admitted to the hospital, checkout time is 11:00 AM the day of discharge.   Patients discharged the day of surgery will not be allowed to drive home.  Name and phone number of your driver: :    Special Instructions: Shower using CHG 2 nights before surgery and the night before surgery.  If you shower the day of surgery use CHG.  Use special wash - you have one bottle of CHG for all showers.  You should use approximately 1/3 of the bottle for each shower.   Please read over the following fact sheets that you were given: Pain Booklet, Coughing and Deep Breathing and Surgical Site Infection Prevention

## 2012-05-18 ENCOUNTER — Encounter (HOSPITAL_COMMUNITY): Payer: Self-pay

## 2012-05-18 ENCOUNTER — Encounter (INDEPENDENT_AMBULATORY_CARE_PROVIDER_SITE_OTHER): Payer: Self-pay | Admitting: General Surgery

## 2012-05-18 ENCOUNTER — Encounter (HOSPITAL_COMMUNITY)
Admission: RE | Admit: 2012-05-18 | Discharge: 2012-05-18 | Disposition: A | Discharge status: Home / Self Care | Payer: BC Managed Care – PPO | Source: Ambulatory Visit | Attending: General Surgery | Admitting: General Surgery

## 2012-05-18 ENCOUNTER — Encounter (HOSPITAL_COMMUNITY)
Admission: RE | Admit: 2012-05-18 | Discharge: 2012-05-18 | Disposition: A | Discharge status: Home / Self Care | Payer: BC Managed Care – PPO | Source: Ambulatory Visit | Attending: Anesthesiology | Admitting: Anesthesiology

## 2012-05-18 ENCOUNTER — Encounter (HOSPITAL_COMMUNITY): Payer: Self-pay | Admitting: Vascular Surgery

## 2012-05-18 ENCOUNTER — Telehealth (INDEPENDENT_AMBULATORY_CARE_PROVIDER_SITE_OTHER): Payer: Self-pay | Admitting: General Surgery

## 2012-05-18 DIAGNOSIS — I10 Essential (primary) hypertension: Secondary | ICD-10-CM | POA: Insufficient documentation

## 2012-05-18 DIAGNOSIS — Z01818 Encounter for other preprocedural examination: Secondary | ICD-10-CM | POA: Insufficient documentation

## 2012-05-18 DIAGNOSIS — N63 Unspecified lump in unspecified breast: Secondary | ICD-10-CM | POA: Insufficient documentation

## 2012-05-18 HISTORY — DX: Angina pectoris, unspecified: I20.9

## 2012-05-18 HISTORY — DX: Shortness of breath: R06.02

## 2012-05-18 HISTORY — DX: Malignant (primary) neoplasm, unspecified: C80.1

## 2012-05-18 HISTORY — DX: Calculus of kidney: N20.0

## 2012-05-18 HISTORY — DX: Pneumonia, unspecified organism: J18.9

## 2012-05-18 HISTORY — DX: Anxiety disorder, unspecified: F41.9

## 2012-05-18 HISTORY — DX: Sleep apnea, unspecified: G47.30

## 2012-05-18 LAB — CBC WITH DIFFERENTIAL/PLATELET
Eosinophils Absolute: 0.3 10*3/uL (ref 0.0–0.7)
Lymphocytes Relative: 28 % (ref 12–46)
Lymphs Abs: 3.1 10*3/uL (ref 0.7–4.0)
MCH: 27.9 pg (ref 26.0–34.0)
Neutro Abs: 6.9 10*3/uL (ref 1.7–7.7)
Neutrophils Relative %: 62 % (ref 43–77)
Platelets: 300 10*3/uL (ref 150–400)
RBC: 4.81 MIL/uL (ref 3.87–5.11)
WBC: 11.2 10*3/uL — ABNORMAL HIGH (ref 4.0–10.5)

## 2012-05-18 LAB — BASIC METABOLIC PANEL
Calcium: 9.6 mg/dL (ref 8.4–10.5)
GFR calc Af Amer: >90 mL/min (ref >90)
GFR calc non Af Amer: >90 mL/min (ref >90)
Sodium: 141 mEq/L (ref 135–145)

## 2012-05-18 LAB — SURGICAL PCR SCREEN
MRSA, PCR: NEGATIVE
Staphylococcus aureus: POSITIVE — AB

## 2012-05-18 NOTE — Progress Notes (Signed)
NOTIFIED [name] OF PATIENT STATING SHE WAS TOLD SHE WAS A DIFFICULT INTUBATION 17-18 YEARS AGO AT [facility].  PATIENT ALSO STATED SHE HAS HAD CP 2 WEEKS AGO, SHE HAS HX AFIB SEES CARDIOLOGIST S AT [facility] (DR. Clide Cliff [phone] DR. Roselie Awkward [phone].  PCP DR. TERRY DANIEL IN [phone]   [facility]

## 2012-05-18 NOTE — Consult Note (Signed)
Anesthesia consult: Patient is a 56 year old female scheduled for excision of left breast mass, presumes organized hematoma.  Procedure is scheduled for 05/24/12 by Dr. Dwain Sarna.    History includes atrial fibrillation/flutter, mild to moderate CAD by cath in 2008, obesity, hypertension, hyperlipidemia, anxiety, nephrolithiasis, diabetes mellitus type 2, depression, obstructive sleep apnea with CPAP use, bladder cancer status post excision in 2006, and DIFFICULT AIRWAY (she was told she has a small mouth--has not required an awake intubation). PCP is Dr. Donzetta Sprung at [facility] in [place].  Notes indicate he has given medical clearance for this procedure with plans for a Lovenox bridge.  She sees Cardiologist Dr. Clide Cliff (afib) and Dr. Carleene Cooper (CAD) at [facility].  Records are still pending.  EKG on 05/18/12 showed a-flutter with variable AV block, cannot rule out anterior infarct (age undetermined).  Stress echo on 09/25/11 [facility]) showed negative stress ECG for inducible ischemia at target heart rate. NSR with PACs. Rest echo: showed estimated LV ejection fraction at 50-55%. Global systolic function is preserved. At baseline the mid anteroseptum appeared sluggish, otherwise normal wall motion. Stress echo: The LV ejection fraction is 40%. There was decrease in left ventricular function post exercise. With stress, the mid anterior and anteroseptal wall went to marked hypokinesis with extension to the LV apex. With stress, there is an increased in LV cavity size. Positive exercise echocardiography for inducible ischemia at heart rate achieved.  Cardiac cath on 10/15/10 showed: 1. Persistent atrial fibrillation.  2. Well-preserved left ventricular function.  3. Moderate plaquing of the proximal/mid left anterior descending. Mild to moderate plaquing of the circumflex coronary artery. (30%-40% mid septal perforator, 20-30% mid to distal LAD, 20-30% the diagonal was felt to be insignificant  and had 70% stenosis,  30-40% proximal OM1, minimal luminal irregularities of the RCA.)  Chest x-ray on 05/18/2012 showed no active disease.  Preoperative labs noted.  I was asked to evaluate Ms. Klapper at her PAT visit this morning due to her history of difficult intubation. She also reported an episode of chest pain 2 weeks ago.  She reports being told her mouth was small when she underwent anesthesia for a right oophorectomy at [facility] > 15 years ago.  She has had multiple surgeries since, most recently in 2006 and 2010 (will request from medical records).  In regards to her chest pain history, she described rather chronic (every few weeks) exertional chest pain when carrying a heavy load or walking up stairs.  She describes pain it had knee and occurring in her mid chest with occasional radiation to her right arm. There is associated shortness of breath and tachycardia. She denies associated dizziness, nausea or diaphoresis.  Pain dissipates relatively quickly with rest. She has not required nitroglycerin or her emergency room evaluation. She states that she saw Dr. Newt Lukes a few months ago, and he is aware of her symptoms.  In fact, she had a stress echocardiogram within the past year for evaluation of chest pain.  Exam show an obesity, pleasant, Caucasian female in NAD.  Airway does demonstrate a small mouth.  Heart irregularly irregular, with no significant murmur noted.  Lungs clear.  No carotid bruits or LE edema noted.  I reviewed above and currently available records with Anesthesiologist Dr. Jacklynn Bue.  Due to her history of what sounds like stable angina and aflutter with borderline rate control, we feel she should be cleared by cardiology pre-operatively.  I spoke with Dr. Doreen Salvage nurse Penni Bombard this afternoon and reported  patient's symptoms.  She will discuss with Dr. Dwain Sarna, fax today's EKG to her cardiologist, and contact their office regarding need for pre-operative  clearance.  I'll follow-up once additional records are available.  Shonna Chock, PA-C 05/18/12 1745

## 2012-05-18 NOTE — Telephone Encounter (Signed)
Revonda Standard the short stay PA called to let us know that this pt is scheduled for surgery on 12/23 and she sees that we received clearance from Dr. Reuel Boom but that the patient has been complaining of chest pain about 2 weeks ago.  They did an EKG today and they would like these faxed to her cardiologist at [facility], Dr. Carleene Cooper, to see if he will clear her for surgery.  I informed her that as soon as I heard from Dr. Newt Lukes, I would call her and let her know.

## 2012-05-19 ENCOUNTER — Telehealth (INDEPENDENT_AMBULATORY_CARE_PROVIDER_SITE_OTHER): Payer: Self-pay | Admitting: General Surgery

## 2012-05-19 NOTE — Telephone Encounter (Signed)
Pt had abnormal EKG at pre-op.  She has chronic afib which her cardiologist, Lajean Silvius, MD [facility] [phone]) is aware.  I called his office and spoke with his nurse to request written cardiac clearance be faxed to our office.  She will pass the message on to Dr. Marny Lowenstein.

## 2012-05-19 NOTE — Telephone Encounter (Signed)
LMOm asking pt to return my call.

## 2012-05-19 NOTE — Telephone Encounter (Signed)
Message copied by Littie Deeds on Wed May 19, 2012 12:20 PM ------      Message from: Dwain Sarna, MATTHEW      Created: Wed May 19, 2012 10:17 AM       She needs to see her cardiologist prior to Monday. If she does not see by Friday with note that states she is cleared for surgery without any further evaluation then it is cancelled.  This is very elective and if there is any risk I am going to cancel.      ----- Message -----         From: [facility]         Sent: 05/18/2012   9:06 AM           To: Emelia Loron, MD

## 2012-05-20 ENCOUNTER — Encounter (INDEPENDENT_AMBULATORY_CARE_PROVIDER_SITE_OTHER): Payer: Self-pay

## 2012-05-20 ENCOUNTER — Telehealth (INDEPENDENT_AMBULATORY_CARE_PROVIDER_SITE_OTHER): Payer: Self-pay | Admitting: General Surgery

## 2012-05-20 NOTE — Telephone Encounter (Signed)
Spoke with Pamelia Hoit, Dr. Dawna Part nurse, and explained that because the patient has not physically been evaluated by Dr. Newt Lukes, that Dr. Dwain Sarna would like to postpone this elective surgery until after he has examined her for clearance.  They went ahead and scheduled her an appt to see Dr. Newt Lukes on 06/29/12 appt 10:30. I informed them that I would make the pt aware of this information.

## 2012-05-20 NOTE — Telephone Encounter (Signed)
Spoke with pt and informed her that even though we had received a cardiac clearance letter from Dr. Newt Lukes, Dr. Dwain Sarna would like her to be examined by Dr. Newt Lukes before he continues with this elective procedure.  I explained to the patient that we would have to postpone the surgery until after she has an appt w/ Dr. Newt Lukes on 1/28 at 10:30.  She was extremely upset with this information and explained that she does not know when she will do the surgery now because she was strategically planning this over her holiday break.

## 2012-05-21 ENCOUNTER — Telehealth (INDEPENDENT_AMBULATORY_CARE_PROVIDER_SITE_OTHER): Payer: Self-pay | Admitting: General Surgery

## 2012-05-21 ENCOUNTER — Telehealth (INDEPENDENT_AMBULATORY_CARE_PROVIDER_SITE_OTHER): Payer: Self-pay

## 2012-05-21 NOTE — Telephone Encounter (Signed)
Pt's husband wanted to speak with me as to why his wife had been cancelled.  I explained that I have spoken to his wife and explained to her, but I also reiterated to him what I informed his wife of.  He too, was upset with this information.

## 2012-05-21 NOTE — Telephone Encounter (Signed)
The husband called with concerns about the surgery being cancelled.  He said she had some chest pains but has since been cleared by her heart dr.  He is wanting to know about the money they have put towards surgery and if they will be reimbursed.  Can someone please call them.

## 2012-05-24 ENCOUNTER — Encounter (HOSPITAL_COMMUNITY): Admission: RE | Payer: Self-pay | Source: Ambulatory Visit

## 2012-05-24 ENCOUNTER — Ambulatory Visit (HOSPITAL_COMMUNITY): Admission: RE | Admit: 2012-05-24 | Payer: BC Managed Care – PPO | Source: Ambulatory Visit | Admitting: General Surgery

## 2012-05-24 SURGERY — EXCISION OF BREAST BIOPSY
Anesthesia: General | Laterality: Left

## 2012-06-03 ENCOUNTER — Encounter (INDEPENDENT_AMBULATORY_CARE_PROVIDER_SITE_OTHER): Payer: Self-pay

## 2012-12-16 ENCOUNTER — Other Ambulatory Visit: Payer: Self-pay

## 2012-12-16 DIAGNOSIS — Z1231 Encounter for screening mammogram for malignant neoplasm of breast: Secondary | ICD-10-CM

## 2013-01-17 ENCOUNTER — Ambulatory Visit
Admission: RE | Admit: 2013-01-17 | Discharge: 2013-01-17 | Disposition: A | Discharge status: Home / Self Care | Payer: BC Managed Care – PPO | Source: Ambulatory Visit

## 2013-01-17 DIAGNOSIS — Z1231 Encounter for screening mammogram for malignant neoplasm of breast: Secondary | ICD-10-CM

## 2013-12-07 ENCOUNTER — Other Ambulatory Visit: Payer: Self-pay

## 2013-12-07 DIAGNOSIS — Z1231 Encounter for screening mammogram for malignant neoplasm of breast: Secondary | ICD-10-CM

## 2014-01-18 ENCOUNTER — Ambulatory Visit: Payer: BC Managed Care – PPO

## 2014-01-31 ENCOUNTER — Ambulatory Visit
Admission: RE | Admit: 2014-01-31 | Discharge: 2014-01-31 | Disposition: A | Discharge status: Home / Self Care | Payer: BC Managed Care – PPO | Source: Ambulatory Visit

## 2014-01-31 DIAGNOSIS — Z1231 Encounter for screening mammogram for malignant neoplasm of breast: Secondary | ICD-10-CM

## 2014-02-02 HISTORY — PX: CARDIAC CATHETERIZATION: SHX172

## 2014-02-22 ENCOUNTER — Other Ambulatory Visit: Payer: Self-pay | Admitting: Obstetrics and Gynecology

## 2014-02-23 LAB — CYTOLOGY - PAP

## 2014-03-28 ENCOUNTER — Other Ambulatory Visit (INDEPENDENT_AMBULATORY_CARE_PROVIDER_SITE_OTHER): Payer: Self-pay | Admitting: General Surgery

## 2014-05-04 NOTE — Progress Notes (Signed)
Pt called to reschedule PAT, transferred to [facility] (scheduler) voice mail as she had already left for the day.

## 2014-05-10 ENCOUNTER — Encounter (HOSPITAL_COMMUNITY)
Admission: RE | Admit: 2014-05-10 | Discharge: 2014-05-10 | Disposition: A | Discharge status: Home / Self Care | Payer: BC Managed Care – PPO | Source: Ambulatory Visit | Attending: Anesthesiology | Admitting: Anesthesiology

## 2014-05-10 ENCOUNTER — Encounter (HOSPITAL_COMMUNITY)
Admission: RE | Admit: 2014-05-10 | Discharge: 2014-05-10 | Disposition: A | Discharge status: Home / Self Care | Payer: BC Managed Care – PPO | Source: Ambulatory Visit | Attending: General Surgery | Admitting: General Surgery

## 2014-05-10 ENCOUNTER — Encounter (HOSPITAL_COMMUNITY): Payer: Self-pay

## 2014-05-10 DIAGNOSIS — Z6841 Body Mass Index (BMI) 40.0 and over, adult: Secondary | ICD-10-CM | POA: Diagnosis not present

## 2014-05-10 DIAGNOSIS — N63 Unspecified lump in breast: Secondary | ICD-10-CM | POA: Diagnosis not present

## 2014-05-10 DIAGNOSIS — F329 Major depressive disorder, single episode, unspecified: Secondary | ICD-10-CM | POA: Diagnosis not present

## 2014-05-10 DIAGNOSIS — I240 Acute coronary thrombosis not resulting in myocardial infarction: Secondary | ICD-10-CM | POA: Diagnosis not present

## 2014-05-10 DIAGNOSIS — C679 Malignant neoplasm of bladder, unspecified: Secondary | ICD-10-CM | POA: Diagnosis not present

## 2014-05-10 DIAGNOSIS — I481 Persistent atrial fibrillation: Secondary | ICD-10-CM | POA: Insufficient documentation

## 2014-05-10 DIAGNOSIS — N200 Calculus of kidney: Secondary | ICD-10-CM | POA: Diagnosis not present

## 2014-05-10 DIAGNOSIS — F419 Anxiety disorder, unspecified: Secondary | ICD-10-CM | POA: Insufficient documentation

## 2014-05-10 DIAGNOSIS — I4892 Unspecified atrial flutter: Secondary | ICD-10-CM | POA: Insufficient documentation

## 2014-05-10 DIAGNOSIS — G4733 Obstructive sleep apnea (adult) (pediatric): Secondary | ICD-10-CM | POA: Insufficient documentation

## 2014-05-10 DIAGNOSIS — I517 Cardiomegaly: Secondary | ICD-10-CM | POA: Diagnosis not present

## 2014-05-10 DIAGNOSIS — E785 Hyperlipidemia, unspecified: Secondary | ICD-10-CM | POA: Insufficient documentation

## 2014-05-10 DIAGNOSIS — I2510 Atherosclerotic heart disease of native coronary artery without angina pectoris: Secondary | ICD-10-CM | POA: Insufficient documentation

## 2014-05-10 DIAGNOSIS — I10 Essential (primary) hypertension: Secondary | ICD-10-CM | POA: Diagnosis not present

## 2014-05-10 DIAGNOSIS — E119 Type 2 diabetes mellitus without complications: Secondary | ICD-10-CM | POA: Insufficient documentation

## 2014-05-10 DIAGNOSIS — Z01818 Encounter for other preprocedural examination: Secondary | ICD-10-CM | POA: Diagnosis not present

## 2014-05-10 HISTORY — DX: Cardiac arrhythmia, unspecified: I49.9

## 2014-05-10 HISTORY — DX: Atherosclerotic heart disease of native coronary artery without angina pectoris: I25.10

## 2014-05-10 HISTORY — DX: Gastro-esophageal reflux disease without esophagitis: K21.9

## 2014-05-10 LAB — CBC WITH DIFFERENTIAL/PLATELET
BASOS PCT: 0 % (ref 0–1)
Basophils Absolute: 0 10*3/uL (ref 0.0–0.1)
EOS ABS: 0.1 10*3/uL (ref 0.0–0.7)
EOS PCT: 1 % (ref 0–5)
HCT: 40.7 % (ref 36.0–46.0)
HEMOGLOBIN: 13.2 g/dL (ref 12.0–15.0)
LYMPHS ABS: 3.3 10*3/uL (ref 0.7–4.0)
Lymphocytes Relative: 28 % (ref 12–46)
MCH: 28.5 pg (ref 26.0–34.0)
MCHC: 32.4 g/dL (ref 30.0–36.0)
MCV: 87.9 fL (ref 78.0–100.0)
MONO ABS: 0.7 10*3/uL (ref 0.1–1.0)
MONOS PCT: 6 % (ref 3–12)
Neutro Abs: 7.6 10*3/uL (ref 1.7–7.7)
Neutrophils Relative %: 65 % (ref 43–77)
Platelets: 261 10*3/uL (ref 150–400)
RBC: 4.63 MIL/uL (ref 3.87–5.11)
RDW: 14.2 % (ref 11.5–15.5)
WBC: 11.7 10*3/uL — ABNORMAL HIGH (ref 4.0–10.5)

## 2014-05-10 LAB — PROTIME-INR
INR: 1.03 (ref 0.00–1.49)
Prothrombin Time: 13.6 seconds (ref 11.6–15.2)

## 2014-05-10 LAB — BASIC METABOLIC PANEL
Anion gap: 13 (ref 5–15)
BUN: 10 mg/dL (ref 6–23)
CHLORIDE: 101 meq/L (ref 96–112)
CO2: 27 mEq/L (ref 19–32)
CREATININE: 0.67 mg/dL (ref 0.50–1.10)
Calcium: 10.1 mg/dL (ref 8.4–10.5)
GLUCOSE: 245 mg/dL — AB (ref 70–99)
POTASSIUM: 4.5 meq/L (ref 3.7–5.3)
Sodium: 141 mEq/L (ref 137–147)

## 2014-05-10 LAB — APTT: aPTT: 28 seconds (ref 24–37)

## 2014-05-10 NOTE — Pre-Procedure Instructions (Signed)
[name]  05/10/2014   Your procedure is scheduled on:  Friday December 18,2015 at 1400  Report to [facility] at 1200 PM.  Call this number if you have problems the morning of surgery: [phone]   Remember:   Do not eat food or drink liquids after midnight Thursday.   Take these medicines the morning of surgery with A SIP OF WATER: Norvasc,Allegra, Imdur, .and Metoprolol  Stop Xarelto per Dr Duke Salvia instructions 3 days prior to surgery. Stop Aspirin, Multivitamins 5 days prior to surgery 05/14/14. No diabetic meds day of surgery.   Do not wear jewelry, make-up or nail polish.  Do not wear lotions, powders, or perfumes. You may wear deodorant.  Do not shave 48 hours prior to surgery.   Do not bring valuables to the hospital.  [facility] is not responsible   for any belongings or valuables.               Contacts, dentures or bridgework may not be worn into surgery.  Leave suitcase in the car. After surgery it may be brought to your room.  For patients admitted to the hospital, discharge time is determined by your                treatment team.               Patients discharged the day of surgery will not be allowed to drive home.    Special Instructions: [facility] - Preparing for Surgery  Before surgery, you can play an important role.  Because skin is not sterile, your skin needs to be as free of germs as possible.  You can reduce the number of germs on you skin by washing with CHG (chlorahexidine gluconate) soap before surgery.  CHG is an antiseptic cleaner which kills germs and bonds with the skin to continue killing germs even after washing.  Please DO NOT use if you have an allergy to CHG or antibacterial soaps.  If your skin becomes reddened/irritated stop using the CHG and inform your nurse when you arrive at Short Stay.  Do not shave (including legs and underarms) for at least 48 hours prior to the first CHG shower.  You may shave your  face.  Please follow these instructions carefully:   1.  Shower with CHG Soap the night before surgery and the                                morning of Surgery.  2.  If you choose to wash your hair, wash your hair first as usual with your       normal shampoo.  3.  After you shampoo, rinse your hair and body thoroughly to remove the                      Shampoo.  4.  Use CHG as you would any other liquid soap.  You can apply chg directly       to the skin and wash gently with scrungie or a clean washcloth.  5.  Apply the CHG Soap to your body ONLY FROM THE NECK DOWN.        Do not use on open wounds or open sores.  Avoid contact with your eyes,       ears, mouth and genitals (private parts).  Wash genitals (private parts)  with your normal soap.  6.  Wash thoroughly, paying special attention to the area where your surgery        will be performed.  7.  Thoroughly rinse your body with warm water from the neck down.  8.  DO NOT shower/wash with your normal soap after using and rinsing off       the CHG Soap.  9.  Pat yourself dry with a clean towel.            10.  Wear clean pajamas.            11.  Place clean sheets on your bed the night of your first shower and do not        sleep with pets.  Day of Surgery  Do not apply any lotions/deoderants the morning of surgery.  Please wear clean clothes to the hospital/surgery center.      Please read over the following fact sheets that you were given: Pain Booklet, Coughing and Deep Breathing and Surgical Site Infection Prevention

## 2014-05-10 NOTE — Progress Notes (Addendum)
Anesthesia PAT Evaluation: Patient is a 58 year old female scheduled for excision of left breast mass on 05/29/14 by Dr. Donne Hazel.  She was scheduled for this procedure on 05/24/12 (for presumed organized left breast hematoma), but required cardiology clearance.  She was frustrated by the whole process and decided not to proceed with surgery at that time.  More recently, her GYN noticed a change in her left breast so she has reconsidered surgery.  She reports that she has been cleared by her cardiologist, however records are pending.  History includes persistent atrial fibrillation/flutter (despite cardioversion and ablation), CAD with known LAD occlusion by 01/2014 cath, morbid obesity (BMI 40), hypertension, hyperlipidemia, anxiety, nephrolithiasis, diabetes mellitus type 2, depression, obstructive sleep apnea with CPAP use, bladder cancer status post excision in 2006, and DIFFICULT AIRWAY. PCP is Dr. Gar Ponto at [facility] in [place].   In regards to her difficult airway history, she reports being told her mouth was small when she underwent anesthesia for a right oophorectomy at [facility] at age 28.  She has had multiple surgeries since, most recently 08/29/04, 08/24/08 (LMA) and 09/07/08 (LMA).  No history of awake intubation.  She sees cardiologist Dr. Elby Beck and EP cardiologist Dr. Governor Specking [facility]). She reports being told to hold Xarelto 3 days prior to surgery. She is on Toprol BID.  EKG on 05/10/14 showed: afib with RVR at 121 bpm. Low voltage QRS. Non-specific T wave abnormality.  I evaluated patient during PAT.  Her HR was down to 107 bpm after sitting.  She denied SOB at rest, new exertional dyspnea, chest pain, palpitations, edema, syncope.   Cardiac cath on 02/02/14 [facility]): LVEF 50%. Chronic 100% mid LAD. 50% mid CX. 30% proximal RCA. 50% mid RCA. FFR of LCX with IV adenosine - 0.95 - not significant. FFR of RCA with IV adenosine - 0.88 - not significant. No indication  for PCI - continue medical therapy. (Dr. Elby Beck)  Stress echo on 09/25/11 [facility]) was positive for inducible ischemia at heart rate achieved.    Chest x-ray on 05/10/14: Mild cardiomegaly, no CHF. No acute pulmonary disease.  Preoperative labs noted. Non-fasting glucose 245.  She reports fasting glucose is typically ~ 125.   Exam show an obesity, pleasant, Caucasian female in NAD. Small mouth. Heart irregularly irregular, tachycardic (low 100's). No significant murmur noted. Lungs clear. No LE edema noted.  Chart will be left for follow-up regarding additional records.  In regards to her afib with suboptimal rate control this morning.  She reports typically rate is < 100. Her rate in the 120's was not sustained, and she was asymptomatic so she was sent home from PAT.  She has a BP monitor at home which she says will also register her HR.  She will monitor her rate at home.  We discussed seeking immediate medical attention if she becomes symptomatic or if her HR remains significantly elevated.  If her rate is staying consistently above 100, we discussed need for further cardiology input in hopes that she will be better rate controlled by her surgery date.  I will call her in the next couple of days for an update.  Anesthesiologist Dr. Orene Desanctis updated.    George Hugh [facility] Center/Anesthesiology Phone [phone] 05/10/2014 5:55 PM  Addendum:  I called and spoke with patient for HR follow-up.  She reports her HR within the past hour was 99 bpm.  Although a HR of 99 would be acceptable  for OR, I did recommend that the safest option would be for her to contact her EP cardiologist to inquire if he had any additional preoperative recommendations in hopes to give her the best chance of arriving with rate controlled afib. (Dr. Lilli Few last note mentions considering increasing her metoprolol and decreasing her amlodipine for better rate control.)  She understands poor afib  rate control could result in her surgery being delayed or possibly canceled.  I have sent Dr. Donne Hazel a staff message regarding above and also spoke with one of his triage nurses.  [facility] RN also confirmed that they had received a cardiac clearance note from her primary cardiologist Dr. Duke Salvia and would fax that note to PAT.   George Hugh [facility]/Anesthesiology Phone [phone] 05/11/2014 4:23 PM

## 2014-05-18 MED ORDER — CEFAZOLIN SODIUM-DEXTROSE 2-3 GM-% IV SOLR
2.0000 g | INTRAVENOUS | Status: AC
  Administered 2014-05-19: 2 g via INTRAVENOUS
  Filled 2014-05-18: qty 50

## 2014-05-19 ENCOUNTER — Encounter (HOSPITAL_COMMUNITY): Payer: Self-pay | Admitting: *Deleted

## 2014-05-19 ENCOUNTER — Encounter (HOSPITAL_COMMUNITY)
Admission: RE | Disposition: A | Discharge status: Home / Self Care | Payer: Self-pay | Source: Ambulatory Visit | Attending: General Surgery

## 2014-05-19 ENCOUNTER — Ambulatory Visit (HOSPITAL_COMMUNITY): Payer: BC Managed Care – PPO | Admitting: Certified Registered Nurse Anesthetist

## 2014-05-19 ENCOUNTER — Ambulatory Visit (HOSPITAL_COMMUNITY): Payer: BC Managed Care – PPO | Admitting: Vascular Surgery

## 2014-05-19 ENCOUNTER — Ambulatory Visit (HOSPITAL_COMMUNITY)
Admission: RE | Admit: 2014-05-19 | Discharge: 2014-05-19 | Disposition: A | Discharge status: Home / Self Care | Payer: BC Managed Care – PPO | Source: Ambulatory Visit | Attending: General Surgery | Admitting: General Surgery

## 2014-05-19 DIAGNOSIS — E119 Type 2 diabetes mellitus without complications: Secondary | ICD-10-CM | POA: Diagnosis not present

## 2014-05-19 DIAGNOSIS — I10 Essential (primary) hypertension: Secondary | ICD-10-CM | POA: Diagnosis not present

## 2014-05-19 DIAGNOSIS — F329 Major depressive disorder, single episode, unspecified: Secondary | ICD-10-CM | POA: Diagnosis not present

## 2014-05-19 DIAGNOSIS — N200 Calculus of kidney: Secondary | ICD-10-CM | POA: Diagnosis not present

## 2014-05-19 DIAGNOSIS — N6489 Other specified disorders of breast: Secondary | ICD-10-CM | POA: Diagnosis not present

## 2014-05-19 DIAGNOSIS — E780 Pure hypercholesterolemia: Secondary | ICD-10-CM | POA: Diagnosis not present

## 2014-05-19 DIAGNOSIS — I4891 Unspecified atrial fibrillation: Secondary | ICD-10-CM | POA: Insufficient documentation

## 2014-05-19 DIAGNOSIS — Z79899 Other long term (current) drug therapy: Secondary | ICD-10-CM | POA: Diagnosis not present

## 2014-05-19 DIAGNOSIS — N63 Unspecified lump in breast: Secondary | ICD-10-CM | POA: Diagnosis present

## 2014-05-19 DIAGNOSIS — G4730 Sleep apnea, unspecified: Secondary | ICD-10-CM | POA: Insufficient documentation

## 2014-05-19 DIAGNOSIS — Z7982 Long term (current) use of aspirin: Secondary | ICD-10-CM | POA: Diagnosis not present

## 2014-05-19 HISTORY — PX: EXCISION OF BREAST BIOPSY: SHX5822

## 2014-05-19 LAB — GLUCOSE, CAPILLARY
GLUCOSE-CAPILLARY: 153 mg/dL — AB (ref 70–99)
Glucose-Capillary: 167 mg/dL — ABNORMAL HIGH (ref 70–99)
Glucose-Capillary: 149 mg/dL — ABNORMAL HIGH (ref 70–99)

## 2014-05-19 SURGERY — EXCISION OF BREAST BIOPSY
Anesthesia: General | Site: Breast | Laterality: Left

## 2014-05-19 MED ORDER — PROPOFOL 10 MG/ML IV BOLUS
INTRAVENOUS | Status: AC
  Filled 2014-05-19: qty 20

## 2014-05-19 MED ORDER — MIDAZOLAM HCL 5 MG/5ML IJ SOLN
INTRAMUSCULAR | Status: DC | PRN
  Administered 2014-05-19: 2 mg via INTRAVENOUS

## 2014-05-19 MED ORDER — ACETAMINOPHEN 325 MG PO TABS: 650.0000 mg | ORAL_TABLET | ORAL | Status: DC | PRN

## 2014-05-19 MED ORDER — ACETAMINOPHEN 650 MG RE SUPP: 650.0000 mg | RECTAL | Status: DC | PRN

## 2014-05-19 MED ORDER — FENTANYL CITRATE 0.05 MG/ML IJ SOLN
INTRAMUSCULAR | Status: AC
  Filled 2014-05-19: qty 5

## 2014-05-19 MED ORDER — BUPIVACAINE-EPINEPHRINE 0.25% -1:200000 IJ SOLN
INTRAMUSCULAR | Status: DC | PRN
  Administered 2014-05-19: 30 mL

## 2014-05-19 MED ORDER — SODIUM CHLORIDE 0.9 % IJ SOLN: 3.0000 mL | Freq: Two times a day (BID) | INTRAMUSCULAR | Status: DC

## 2014-05-19 MED ORDER — LACTATED RINGERS IV SOLN
INTRAVENOUS | Status: DC
  Administered 2014-05-19: 13:00:00 via INTRAVENOUS

## 2014-05-19 MED ORDER — PHENYLEPHRINE HCL 10 MG/ML IJ SOLN
10.0000 mg | INTRAVENOUS | Status: DC | PRN
  Administered 2014-05-19: 20 ug/min via INTRAVENOUS

## 2014-05-19 MED ORDER — 0.9 % SODIUM CHLORIDE (POUR BTL) OPTIME
TOPICAL | Status: DC | PRN
  Administered 2014-05-19: 1000 mL

## 2014-05-19 MED ORDER — ACETAMINOPHEN 500 MG PO TABS: 1000.0000 mg | ORAL_TABLET | Freq: Four times a day (QID) | ORAL | Status: DC

## 2014-05-19 MED ORDER — PROPOFOL 10 MG/ML IV BOLUS
INTRAVENOUS | Status: DC | PRN
  Administered 2014-05-19: 200 mg via INTRAVENOUS
  Administered 2014-05-19: 20 mg via INTRAVENOUS

## 2014-05-19 MED ORDER — SODIUM CHLORIDE 0.9 % IV SOLN: INTRAVENOUS | Status: DC

## 2014-05-19 MED ORDER — ONDANSETRON HCL 4 MG/2ML IJ SOLN
INTRAMUSCULAR | Status: AC
  Filled 2014-05-19: qty 2

## 2014-05-19 MED ORDER — FENTANYL CITRATE 0.05 MG/ML IJ SOLN
INTRAMUSCULAR | Status: DC | PRN
  Administered 2014-05-19: 50 ug via INTRAVENOUS

## 2014-05-19 MED ORDER — ROCURONIUM BROMIDE 50 MG/5ML IV SOLN
INTRAVENOUS | Status: AC
  Filled 2014-05-19: qty 1

## 2014-05-19 MED ORDER — LIDOCAINE HCL (CARDIAC) 20 MG/ML IV SOLN
INTRAVENOUS | Status: AC
  Filled 2014-05-19: qty 5

## 2014-05-19 MED ORDER — OXYCODONE HCL 5 MG PO TABS: 5.0000 mg | ORAL_TABLET | ORAL | Status: DC | PRN

## 2014-05-19 MED ORDER — ONDANSETRON HCL 4 MG/2ML IJ SOLN
INTRAMUSCULAR | Status: DC | PRN
Start: 2014-05-19 — End: 2014-05-19
  Administered 2014-05-19: 4 mg via INTRAVENOUS

## 2014-05-19 MED ORDER — BUPIVACAINE-EPINEPHRINE (PF) 0.25% -1:200000 IJ SOLN
INTRAMUSCULAR | Status: AC
  Filled 2014-05-19: qty 30

## 2014-05-19 MED ORDER — ONDANSETRON HCL 4 MG/2ML IJ SOLN: 4.0000 mg | Freq: Four times a day (QID) | INTRAMUSCULAR | Status: DC | PRN

## 2014-05-19 MED ORDER — PHENYLEPHRINE 40 MCG/ML (10ML) SYRINGE FOR IV PUSH (FOR BLOOD PRESSURE SUPPORT)
PREFILLED_SYRINGE | INTRAVENOUS | Status: AC
  Filled 2014-05-19: qty 10

## 2014-05-19 MED ORDER — FENTANYL CITRATE 0.05 MG/ML IJ SOLN: 25.0000 ug | INTRAMUSCULAR | Status: DC | PRN

## 2014-05-19 MED ORDER — MIDAZOLAM HCL 2 MG/2ML IJ SOLN
INTRAMUSCULAR | Status: AC
  Filled 2014-05-19: qty 2

## 2014-05-19 MED ORDER — LIDOCAINE HCL (CARDIAC) 20 MG/ML IV SOLN
INTRAVENOUS | Status: DC | PRN
  Administered 2014-05-19: 40 mg via INTRAVENOUS

## 2014-05-19 MED ORDER — LACTATED RINGERS IV SOLN
INTRAVENOUS | Status: DC | PRN
  Administered 2014-05-19 (×2): via INTRAVENOUS

## 2014-05-19 MED ORDER — SODIUM CHLORIDE 0.9 % IJ SOLN: 3.0000 mL | INTRAMUSCULAR | Status: DC | PRN

## 2014-05-19 MED ORDER — MORPHINE SULFATE 2 MG/ML IJ SOLN: 2.0000 mg | INTRAMUSCULAR | Status: DC | PRN

## 2014-05-19 MED ORDER — PHENYLEPHRINE HCL 10 MG/ML IJ SOLN
INTRAMUSCULAR | Status: DC | PRN
  Administered 2014-05-19 (×2): 80 ug via INTRAVENOUS
  Administered 2014-05-19: 40 ug via INTRAVENOUS
  Administered 2014-05-19 (×2): 80 ug via INTRAVENOUS
  Administered 2014-05-19: 40 ug via INTRAVENOUS

## 2014-05-19 MED ORDER — SODIUM CHLORIDE 0.9 % IV SOLN: 250.0000 mL | INTRAVENOUS | Status: DC | PRN

## 2014-05-19 MED ORDER — OXYCODONE-ACETAMINOPHEN 10-325 MG PO TABS: 1.0000 | ORAL_TABLET | Freq: Four times a day (QID) | ORAL | Status: AC | PRN

## 2014-05-19 SURGICAL SUPPLY — 63 items
APL SKNCLS STERI-STRIP NONHPOA (GAUZE/BANDAGES/DRESSINGS)
APPLIER CLIP 9.375 MED OPEN (MISCELLANEOUS)
APR CLP MED 9.3 20 MLT OPN (MISCELLANEOUS)
BENZOIN TINCTURE PRP APPL 2/3 (GAUZE/BANDAGES/DRESSINGS) ×1 IMPLANT
BINDER BREAST LRG (GAUZE/BANDAGES/DRESSINGS) IMPLANT
BINDER BREAST XLRG (GAUZE/BANDAGES/DRESSINGS) ×2 IMPLANT
BLADE SURG 10 STRL SS (BLADE) ×2 IMPLANT
BLADE SURG 15 STRL LF DISP TIS (BLADE) IMPLANT
BLADE SURG 15 STRL SS (BLADE) ×3
BLADE SURG ROTATE 9660 (MISCELLANEOUS) IMPLANT
CANISTER SUCTION 2500CC (MISCELLANEOUS) ×2 IMPLANT
CHLORAPREP W/TINT 26ML (MISCELLANEOUS) ×3 IMPLANT
CLIP APPLIE 9.375 MED OPEN (MISCELLANEOUS) IMPLANT
CLOSURE WOUND 1/2 X4 (GAUZE/BANDAGES/DRESSINGS) ×1
CONT SPEC STER OR (MISCELLANEOUS) ×2 IMPLANT
COVER SURGICAL LIGHT HANDLE (MISCELLANEOUS) ×3 IMPLANT
DECANTER SPIKE VIAL GLASS SM (MISCELLANEOUS) ×3 IMPLANT
DRAPE CHEST BREAST 15X10 FENES (DRAPES) ×3 IMPLANT
DRAPE UTILITY XL STRL (DRAPES) ×2 IMPLANT
ELECT CAUTERY BLADE 6.4 (BLADE) ×3 IMPLANT
ELECT REM PT RETURN 9FT ADLT (ELECTROSURGICAL) ×3
ELECTRODE REM PT RTRN 9FT ADLT (ELECTROSURGICAL) ×1 IMPLANT
GLOVE BIO SURGEON STRL SZ 6.5 (GLOVE) ×2 IMPLANT
GLOVE BIO SURGEON STRL SZ7 (GLOVE) ×6 IMPLANT
GLOVE BIO SURGEONS STRL SZ 6.5 (GLOVE) ×1
GLOVE BIOGEL PI IND STRL 6.5 (GLOVE) IMPLANT
GLOVE BIOGEL PI IND STRL 7.0 (GLOVE) IMPLANT
GLOVE BIOGEL PI IND STRL 7.5 (GLOVE) ×1 IMPLANT
GLOVE BIOGEL PI IND STRL 8 (GLOVE) IMPLANT
GLOVE BIOGEL PI INDICATOR 6.5 (GLOVE) ×2
GLOVE BIOGEL PI INDICATOR 7.0 (GLOVE) ×2
GLOVE BIOGEL PI INDICATOR 7.5 (GLOVE) ×2
GLOVE BIOGEL PI INDICATOR 8 (GLOVE) ×2
GLOVE ECLIPSE 6.5 STRL STRAW (GLOVE) ×2 IMPLANT
GOWN STRL REUS W/ TWL LRG LVL3 (GOWN DISPOSABLE) ×2 IMPLANT
GOWN STRL REUS W/TWL LRG LVL3 (GOWN DISPOSABLE) ×6
KIT BASIN OR (CUSTOM PROCEDURE TRAY) ×3 IMPLANT
KIT MARKER MARGIN INK (KITS) ×2 IMPLANT
KIT ROOM TURNOVER OR (KITS) ×3 IMPLANT
LIQUID BAND (GAUZE/BANDAGES/DRESSINGS) ×3 IMPLANT
NDL HYPO 25GX1X1/2 BEV (NEEDLE) ×1 IMPLANT
NEEDLE HYPO 25GX1X1/2 BEV (NEEDLE) ×3 IMPLANT
NS IRRIG 1000ML POUR BTL (IV SOLUTION) ×3 IMPLANT
PACK SURGICAL SETUP 50X90 (CUSTOM PROCEDURE TRAY) ×3 IMPLANT
PAD ARMBOARD 7.5X6 YLW CONV (MISCELLANEOUS) ×3 IMPLANT
PENCIL BUTTON HOLSTER BLD 10FT (ELECTRODE) ×3 IMPLANT
SPONGE LAP 18X18 X RAY DECT (DISPOSABLE) ×3 IMPLANT
STRIP CLOSURE SKIN 1/2X4 (GAUZE/BANDAGES/DRESSINGS) ×1 IMPLANT
SUT MNCRL AB 4-0 PS2 18 (SUTURE) ×3 IMPLANT
SUT MON AB 5-0 PS2 18 (SUTURE) ×2 IMPLANT
SUT SILK 2 0 SH (SUTURE) IMPLANT
SUT VIC AB 2-0 SH 27 (SUTURE) ×6
SUT VIC AB 2-0 SH 27X BRD (SUTURE) ×1 IMPLANT
SUT VIC AB 2-0 SH 27XBRD (SUTURE) ×1 IMPLANT
SUT VIC AB 3-0 SH 27 (SUTURE) ×3
SUT VIC AB 3-0 SH 27XBRD (SUTURE) ×1 IMPLANT
SYR BULB 3OZ (MISCELLANEOUS) ×3 IMPLANT
SYR CONTROL 10ML LL (SYRINGE) ×3 IMPLANT
TOWEL OR 17X24 6PK STRL BLUE (TOWEL DISPOSABLE) ×3 IMPLANT
TOWEL OR 17X26 10 PK STRL BLUE (TOWEL DISPOSABLE) ×3 IMPLANT
TUBE CONNECTING 12'X1/4 (SUCTIONS)
TUBE CONNECTING 12X1/4 (SUCTIONS) IMPLANT
YANKAUER SUCT BULB TIP NO VENT (SUCTIONS) IMPLANT

## 2014-05-19 NOTE — Interval H&P Note (Signed)
History and Physical Interval Note:  05/19/2014 2:25 PM  [name]  has presented today for surgery, with the diagnosis of LEFT BREAST MASS  The various methods of treatment have been discussed with the patient and family. After consideration of risks, benefits and other options for treatment, the patient has consented to  Procedure(s): LEFT BREAST MASS EXCISIONAL BIOPSY (Left) as a surgical intervention .  The patient's history has been reviewed, patient examined, no change in status, stable for surgery.  I have reviewed the patient's chart and labs.  Questions were answered to the patient's satisfaction.     Alvis Pulcini

## 2014-05-19 NOTE — H&P (Signed)
58 yof who I saw initially in 2013 for a left breast mass. In 2009 [name] had a fall while on coumadin and this area appeared to organize into a hematoma by the time I had seen her. This area has never resolved. [name] has undergone mm and [place] (last a couple years ago) that don't show anything concerning. [name] had mm last month that was negative. I discussed excision before but due to some cardiac issues this was cancelled (active chest pain) and I have not seen her since. [name] is doing really well and has undergone a cath in last month at [facility] [name] says is good and afib/bp has been under good control. [name] states this area has caused her some discomfort but no other changes. No nipple discharge or any other masses. [name] recently saw her gyn Dr Madhuri Vacca Saras who thought this area had changed and was referred back.   Other Problems Atrial Fibrillation Bladder Problems Depression Diabetes Mellitus Hemorrhoids High blood pressure Hypercholesterolemia Kidney Stone Oophorectomy Bilateral. Sleep Apnea  Past Surgical History Colon Polyp Removal - Colonoscopy Gallbladder Surgery - Open Hysterectomy (not due to cancer) - Complete Oral Surgery Tonsillectomy  Allergies Adhesive Bandages Fexible *MEDICAL DEVICES*  Medication History  Aspirin Childrens (81MG  Tablet Chewable, Oral) Active. Estradiol (1MG  Tablet, Oral qd) Active. Allegra Allergy (180MG  Tablet, Oral qd) Active. PROzac (20MG  Capsule, Oral qd) Active. Imdur (30MG  Tablet ER 24HR, Oral qd) Active. MetFORMIN HCl ER (500MG  Tablet ER 24HR, Oral qd) Active. Toprol XL (50MG  Tablet ER 24HR, Oral qd) Active. Multi Vitamin Daily (Oral qd) Active. Potassium Chloride (20MEQ Packet, Oral qd) Active. Xarelto (20MG  Tablet, Oral qd) Active. Crestor (20MG  Tablet, Oral qd) Active. Diovan HCT (320-25MG  Tablet, Oral qd) Active.  Social History  No alcohol use No caffeine use No drug use Tobacco use Never  smoker.  Family History Diabetes Mellitus Father. Heart Disease Father. Heart disease in female family member before age 16 Heart disease in female family member before age 1 Rectal Cancer Mother.   Review of Systems  General Not Present- Appetite Loss, Chills, Fatigue, Fever, Night Sweats, Weight Gain and Weight Loss. Skin Not Present- Change in Wart/Mole, Dryness, Hives, Jaundice, New Lesions, Non-Healing Wounds, Rash and Ulcer. HEENT Present- Seasonal Allergies and Wears glasses/contact lenses. Not Present- Earache, Hearing Loss, Hoarseness, Nose Bleed, Oral Ulcers, Ringing in the Ears, Sinus Pain, Sore Throat, Visual Disturbances and Yellow Eyes. Respiratory Present- Snoring. Not Present- Bloody sputum, Chronic Cough, Difficulty Breathing and Wheezing. Breast Present- Breast Mass and Breast Pain. Not Present- Nipple Discharge and Skin Changes. Cardiovascular Present- Palpitations and Shortness of Breath. Not Present- Chest Pain, Difficulty Breathing Lying Down, Leg Cramps, Rapid Heart Rate and Swelling of Extremities. Gastrointestinal Present- Hemorrhoids. Not Present- Abdominal Pain, Bloating, Bloody Stool, Change in Bowel Habits, Chronic diarrhea, Constipation, Difficulty Swallowing, Excessive gas, Gets full quickly at meals, Indigestion, Nausea, Rectal Pain and Vomiting. Female Genitourinary Not Present- Frequency, Nocturia, Painful Urination, Pelvic Pain and Urgency. Musculoskeletal Not Present- Back Pain, Joint Pain, Joint Stiffness, Muscle Pain, Muscle Weakness and Swelling of Extremities. Neurological Not Present- Decreased Memory, Fainting, Headaches, Numbness, Seizures, Tingling, Tremor, Trouble walking and Weakness. Psychiatric Present- Depression. Not Present- Anxiety, Bipolar, Change in Sleep Pattern, Fearful and Frequent crying. Endocrine Not Present- Cold Intolerance, Excessive Hunger, Hair Changes, Heat Intolerance, Hot flashes and New Diabetes. Hematology Not Present-  Easy Bruising, Excessive bleeding, Gland problems, HIV and Persistent Infections.   Vitals  Weight: 228.38 lb Height: 62in Body Surface Area: 2.13 m Body Mass Index: 41.77  kg/m Temp.: 98.51F(Temporal)  Pulse: 80 (Irregular)  Resp.: 22 (Unlabored)  BP: 122/90 (Standing, Left Arm, Standard)  Physical Exam  General Mental Status-Alert. Orientation-Oriented X3. Chest and Lung Exam Chest and lung exam reveals -normal excursion with symmetric chest walls, quiet, even and easy respiratory effort with no use of accessory muscles and on auscultation, normal breath sounds, no adventitious sounds and normal vocal resonance. Breast Nipples Discharge - Bilateral - None. Breast - Left-Normal. Breast - Right-Normal. Breast Lump Left Central - Size - 5 cm (Height) and 5 cm (Width). Tenderness - Non Tender. Consistency - Firm. Mobility - Mobile. Cardiovascular Auscultation Rhythm - Irregularly irregular.  Assessment & Plan  BREAST MASS, LEFT (611.72  N63) Impression: I still think this is likely organized hematoma but has not gone away. I had discussed excision to make sure previously but due to some cardiac issues we did not do this. [name] is doing better now. I will get clearance from her cardiologists and recommendations on xarelto management. We can do through lovenox bridge. I think we can excise this area via periareolar incision and I counselled them I don't think plastics or anything more than this is necessary at this point. I also dont think a core biopsy is needed as I think this is benign given its stability at this point. I think proceeding to excision is reasonable. Don't want to take off xarelto twice due to comorbidities also. Once we have mgt recs will plan on left breast mass excision under general anesthesia. risks include but not limited to bleeding, hematoma, infection, stroke.

## 2014-05-19 NOTE — Anesthesia Postprocedure Evaluation (Signed)
Anesthesia Post Note  Patient: [name]  Procedure(s) Performed: Procedure(s) (LRB): LEFT BREAST MASS EXCISIONAL BIOPSY (Left)  Anesthesia type: General  Patient location: PACU  Post pain: Pain level controlled  Post assessment: Post-op Vital signs reviewed  Last Vitals: BP 105/59 mmHg  Pulse 86  Temp(Src) 36.6 C (Oral)  Resp 18  Ht 5' 2.5" (1.588 m)  Wt 224 lb (101.606 kg)  BMI 40.29 kg/m2  SpO2 93%  Post vital signs: Reviewed  Level of consciousness: sedated  Complications: No apparent anesthesia complications

## 2014-05-19 NOTE — Interval H&P Note (Signed)
History and Physical Interval Note:  05/19/2014 2:03 PM  [name]  has presented today for surgery, with the diagnosis of LEFT BREAST MASS  The various methods of treatment have been discussed with the patient and family. After consideration of risks, benefits and other options for treatment, the patient has consented to  Procedure(s): LEFT BREAST MASS EXCISIONAL BIOPSY (Left) as a surgical intervention .  The patient's history has been reviewed, patient examined, no change in status, stable for surgery.  I have reviewed the patient's chart and labs.  Questions were answered to the patient's satisfaction.     Dimas Scheck

## 2014-05-19 NOTE — Op Note (Signed)
Preoperative diagnosis: Left breast mass likely old hematoma Postoperative diagnosis: Same as above Procedure: Left breast mass excision Surgeon: Dr. Serita Grammes Anesthesia: Gen. Specimens: Left breast mass to pathology marked with paint Estimated blood loss: Minimal Competitions: None Drains: None Sponge count was correct at completion Disposition to recovery stable  Indications: This a 58 year old female with multiple medical problems. Her history is documented in my history and physical. 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 This is changed and evolved over some time. She would like this area excised and I discussed that with her. She was evaluated by her cardiologist before hand.  Procedure: After informed consent was obtained the patient was taken to the operating room. She was given cefazolin. Sequential compression devices were on her legs. She was placed under general anesthesia without complication. Her left breast was prepped and draped in the standard sterile surgical fashion. A surgical timeout was then performed.  I made a periareolar incision. The mass was easily palpable. I then used cautery to excise the mass in its entirety. I did enter into this cavity and there was old hematoma present throughout. This was removed in total. I did mark it with paint. This was passed off the table as a specimen. I then obtained hemostasis. I closed the breast tissue with 2-0 vicryl. The dermis was closed with 3-0 Vicryl. The skin was closed 5-0 Monocryl. This was infiltrated with Marcaine. I then placed glue over this. A breast binder was placed. She tolerated this well was activated and transferred to recovery stable.

## 2014-05-19 NOTE — Discharge Instructions (Signed)
[facility] °Office Phone Number [phone] ° °BREAST BIOPSY/ PARTIAL MASTECTOMY: POST OP INSTRUCTIONS ° °Always review your discharge instruction sheet given to you by the facility where your surgery was performed. ° °IF YOU HAVE DISABILITY OR FAMILY LEAVE FORMS, YOU MUST BRING THEM TO THE OFFICE FOR PROCESSING.  DO NOT GIVE THEM TO YOUR DOCTOR. ° °1. A prescription for pain medication may be given to you upon discharge.  Take your pain medication as prescribed, if needed.  If narcotic pain medicine is not needed, then you may take acetaminophen (Tylenol), naprosyn (Alleve) or ibuprofen (Advil) as needed. °2. Take your usually prescribed medications unless otherwise directed °3. If you need a refill on your pain medication, please contact your pharmacy.  They will contact our office to request authorization.  Prescriptions will not be filled after 5pm or on week-ends. °4. You should eat very light the first 24 hours after surgery, such as soup, crackers, pudding, etc.  Resume your normal diet the day after surgery. °5. Most patients will experience some swelling and bruising in the breast.  Ice packs and a good support bra will help.  Wear the breast binder provided or a sports bra for 72 hours day and night.  After that wear a sports bra during the day until you return to the office. Swelling and bruising can take several days to resolve.  °6. It is common to experience some constipation if taking pain medication after surgery.  Increasing fluid intake and taking a stool softener will usually help or prevent this problem from occurring.  A mild laxative (Milk of Magnesia or Miralax) should be taken according to package directions if there are no bowel movements after 48 hours. °7. Unless discharge instructions indicate otherwise, you may remove your bandages 48 hours after surgery and you may shower at that time.  You may have steri-strips (small skin tapes) in place directly over the incision.   These strips should be left on the skin for 7-10 days and will come off on their own.  If your surgeon used skin glue on the incision, you may shower in 24 hours.  The glue will flake off over the next 2-3 weeks.  Any sutures or staples will be removed at the office during your follow-up visit. °8. ACTIVITIES:  You may resume regular daily activities (gradually increasing) beginning the next day.  Wearing a good support bra or sports bra minimizes pain and swelling.  You may have sexual intercourse when it is comfortable. °a. You may drive when you no longer are taking prescription pain medication, you can comfortably wear a seatbelt, and you can safely maneuver your car and apply brakes. °b. RETURN TO WORK:  ______________________________________________________________________________________ °9. You should see your doctor in the office for a follow-up appointment approximately two weeks after your surgery.  Your doctor’s nurse will typically make your follow-up appointment when she calls you with your pathology report.  Expect your pathology report 3-4 business days after your surgery.  You may call to check if you do not hear from us after three days. °10. OTHER INSTRUCTIONS: _______________________________________________________________________________________________ _____________________________________________________________________________________________________________________________________ °_____________________________________________________________________________________________________________________________________ °_____________________________________________________________________________________________________________________________________ ° °WHEN TO CALL DR Taden Witter: °1. Fever over 101.0 °2. Nausea and/or vomiting. °3. Extreme swelling or bruising. °4. Continued bleeding from incision. °5. Increased pain, redness, or drainage from the incision. ° °The clinic staff is available to  answer your questions during regular business hours.  Please don’t hesitate to call and ask to speak to one of the nurses for   clinical concerns.  If you have a medical emergency, go to the nearest emergency room or call 911.  A surgeon from [facility] is always on call at the hospital. ° °For further questions, please visit [email or web address] ° °

## 2014-05-19 NOTE — Anesthesia Preprocedure Evaluation (Addendum)
Anesthesia Evaluation  Patient identified by MRN, date of birth, ID band Patient awake    Reviewed: Allergy & Precautions, H&P , NPO status , Patient's Chart, lab work & pertinent test results, reviewed documented beta blocker date and time   History of Anesthesia Complications (+) DIFFICULT AIRWAY and history of anesthetic complications  Airway Mallampati: II  TM Distance: <3 FB Neck ROM: full  Mouth opening: Limited Mouth Opening  Dental  (+) Dental Advisory Given, Teeth Intact   Pulmonary shortness of breath and with exertion, sleep apnea and Continuous Positive Airway Pressure Ventilation ,          Cardiovascular hypertension, Pt. on home beta blockers and Pt. on medications + angina + CAD + dysrhythmias Atrial Fibrillation     Neuro/Psych PSYCHIATRIC DISORDERS Anxiety Depression    GI/Hepatic GERD-  Medicated,  Endo/Other  diabetes, Type 2, Oral Hypoglycemic AgentsMorbid obesity  Renal/GU      Musculoskeletal   Abdominal   Peds  Hematology   Anesthesia Other Findings   Reproductive/Obstetrics                          Anesthesia Physical Anesthesia Plan  ASA: III  Anesthesia Plan: General   Post-op Pain Management:    Induction: Intravenous  Airway Management Planned: LMA  Additional Equipment:   Intra-op Plan:   Post-operative Plan: Extubation in OR  Informed Consent: I have reviewed the patients History and Physical, chart, labs and discussed the procedure including the risks, benefits and alternatives for the proposed anesthesia with the patient or authorized representative who has indicated his/her understanding and acceptance.   Dental advisory given  Plan Discussed with: CRNA, Anesthesiologist and Surgeon  Anesthesia Plan Comments:        Anesthesia Quick Evaluation

## 2014-05-19 NOTE — Transfer of Care (Addendum)
Immediate Anesthesia Transfer of Care Note  Patient: [name]  Procedure(s) Performed: Procedure(s): LEFT BREAST MASS EXCISIONAL BIOPSY (Left)  Patient Location: PACU  Anesthesia Type:General  Level of Consciousness: awake, alert  and oriented  Airway & Oxygen Therapy: Patient Spontanous Breathing and Patient connected to nasal cannula oxygen  Post-op Assessment: Report given to PACU RN, Post -op Vital signs reviewed and stable and Patient moving all extremities X 4  Post vital signs: Reviewed and stable  Complications: No apparent anesthesia complications

## 2014-05-19 NOTE — Anesthesia Procedure Notes (Signed)
Procedure Name: LMA Insertion Date/Time: 05/19/2014 2:48 PM Performed by: Garrison Columbus T Pre-anesthesia Checklist: Patient identified, Emergency Drugs available, Suction available and Patient being monitored Patient Re-evaluated:Patient Re-evaluated prior to inductionOxygen Delivery Method: Circle system utilized Preoxygenation: Pre-oxygenation with 100% oxygen Intubation Type: IV induction LMA: LMA inserted LMA Size: 4.0 Number of attempts: 1 Placement Confirmation: positive ETCO2 and breath sounds checked- equal and bilateral Tube secured with: Tape Dental Injury: Teeth and Oropharynx as per pre-operative assessment

## 2014-05-23 ENCOUNTER — Encounter (HOSPITAL_COMMUNITY): Payer: Self-pay | Admitting: General Surgery

## 2014-08-16 IMAGING — CR DG CHEST 2V
2 series · 2 of 2 positions shown · non-contrast
Comparison: 01/13/2007

CLINICAL DATA: Preop, left breast mass

CHEST - 2 VIEW

[view not recorded (1 of 2)]
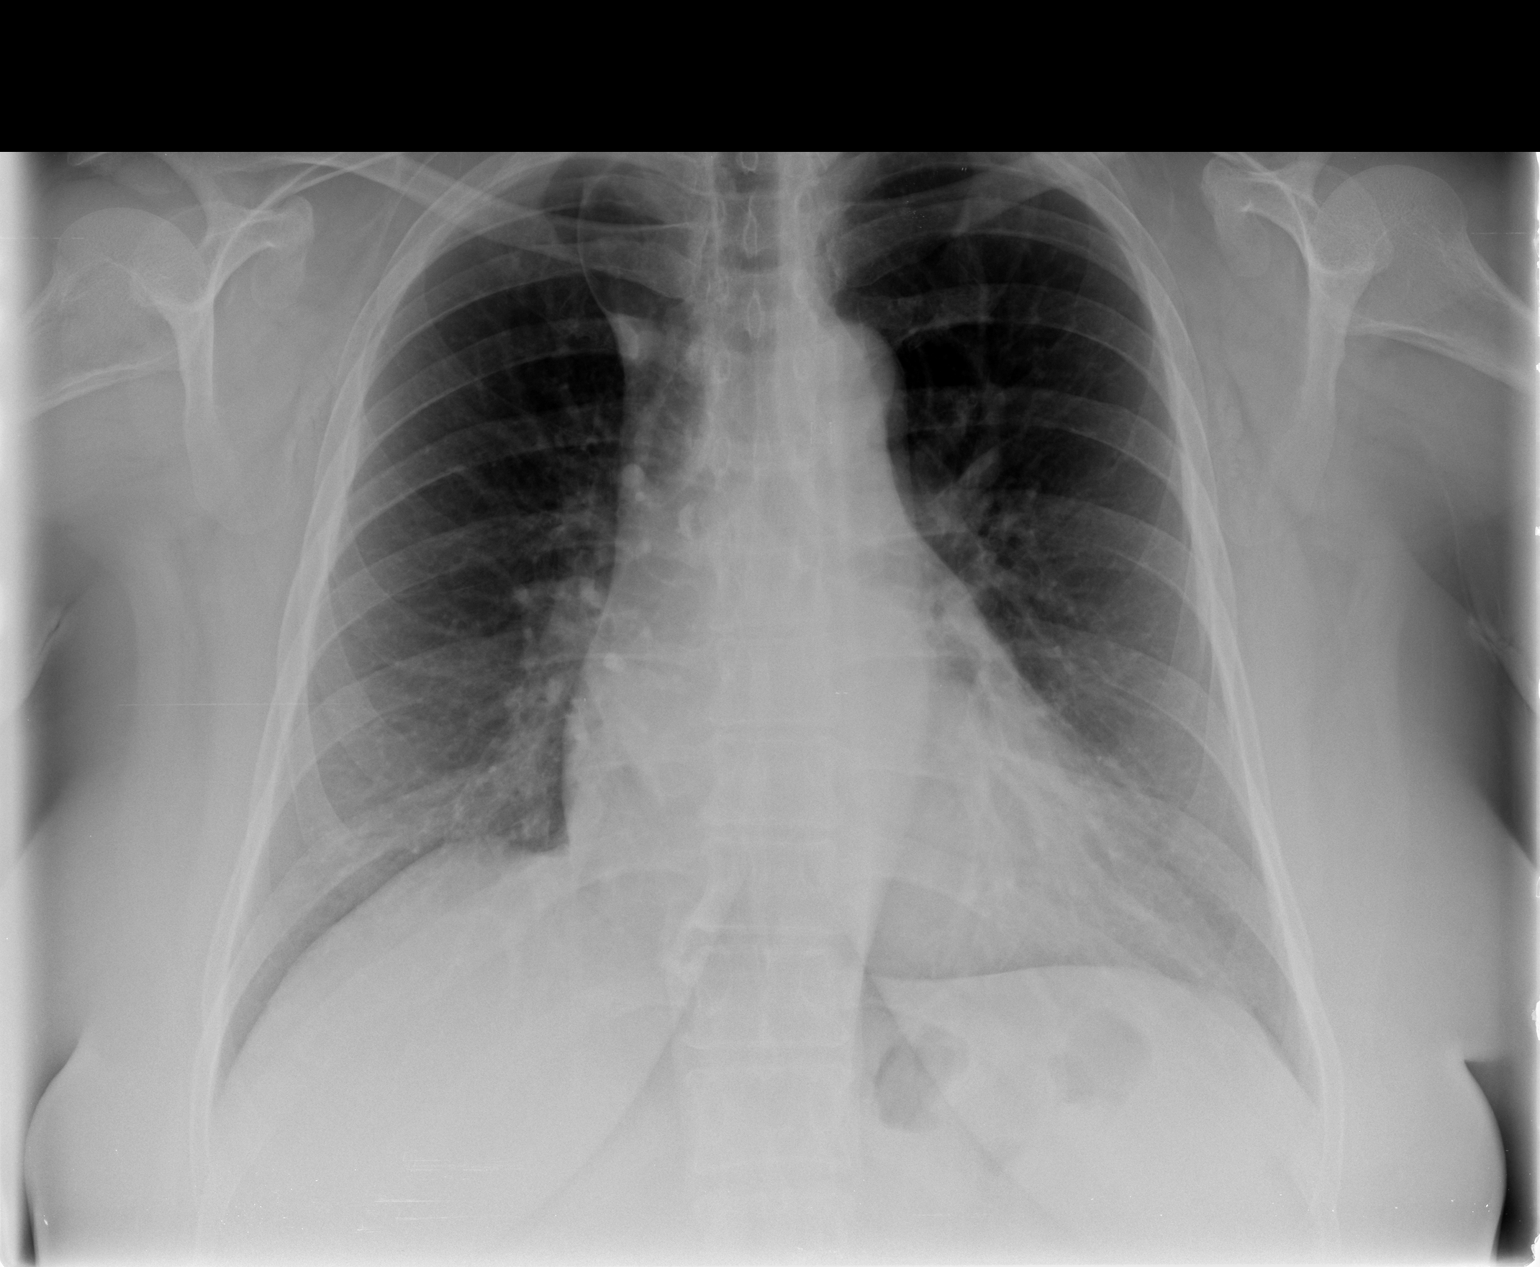

[view not recorded (2 of 2)]
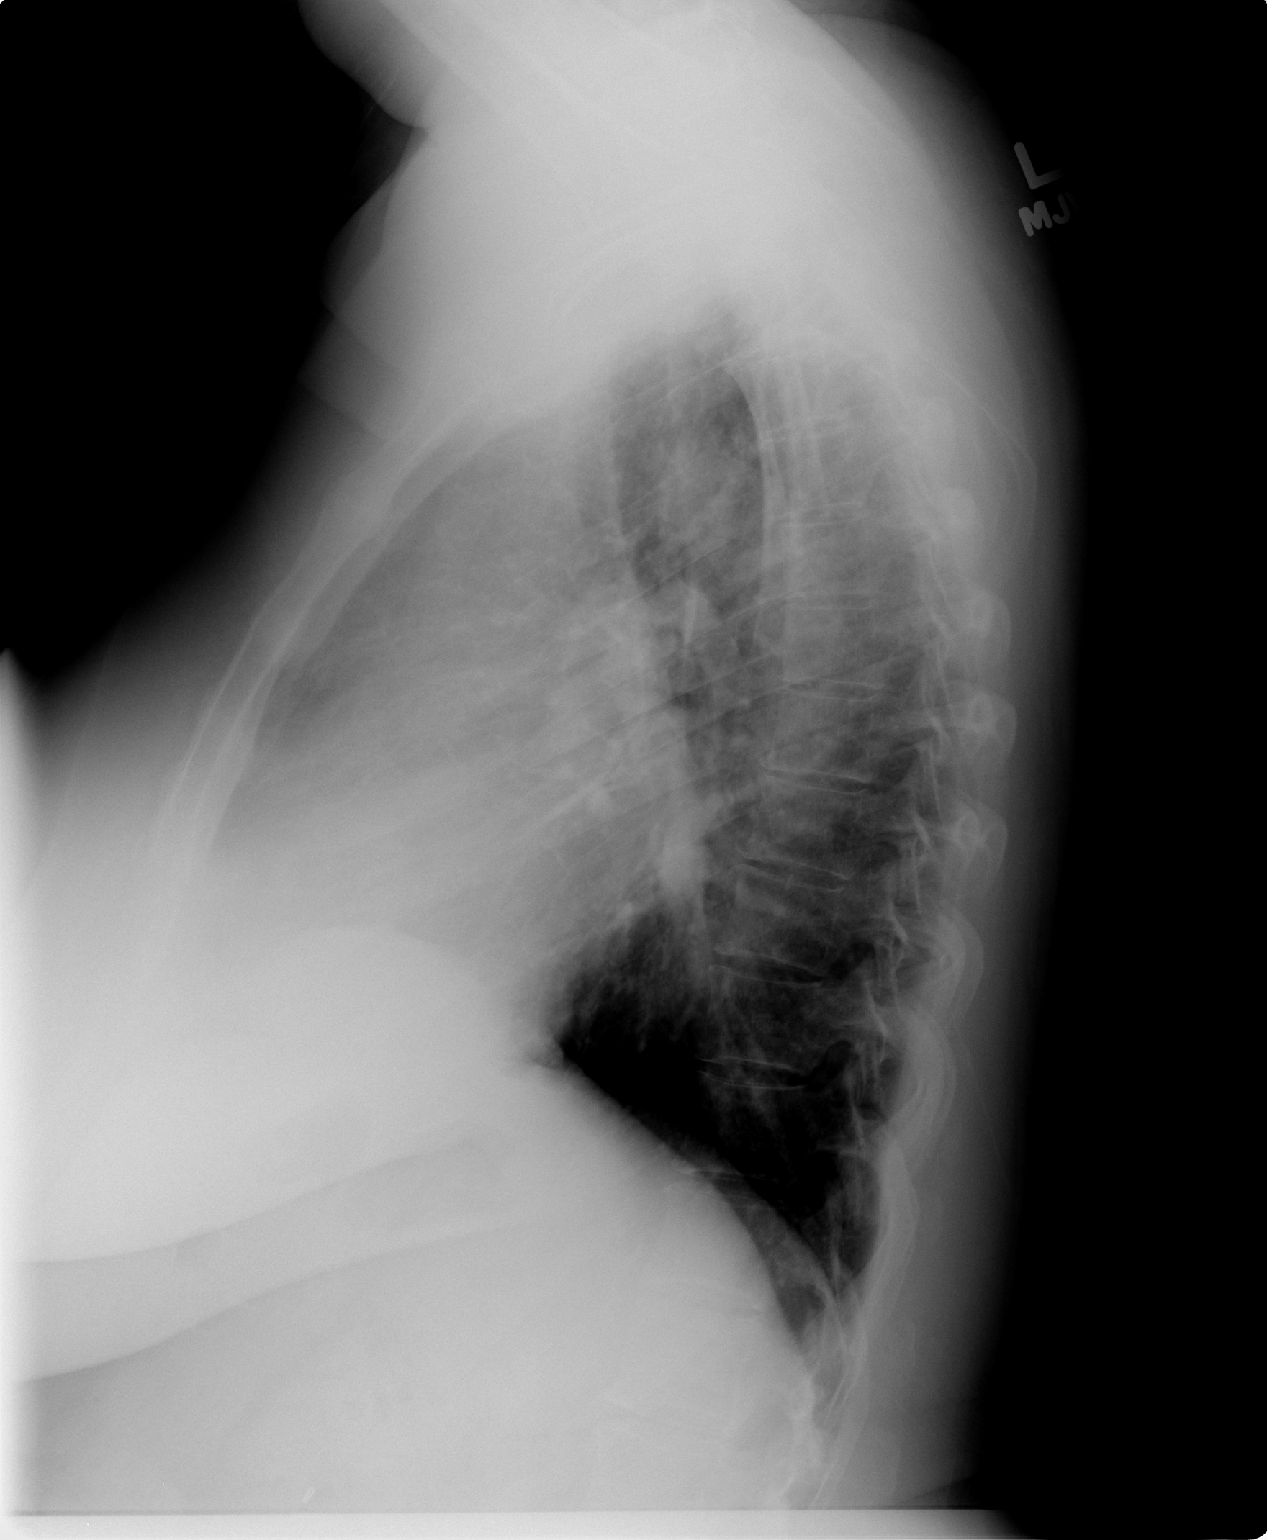

[2 of 2 positions shown; findings below may reference images not displayed]

FINDINGS: Cardiomediastinal silhouette is stable.  No acute
infiltrate or pleural effusion.  No pulmonary edema.  Again noted
accessory azygos fissure/lobe.

Bony thorax is unremarkable.
IMPRESSION: No active disease.  No significant change.

## 2015-01-03 ENCOUNTER — Other Ambulatory Visit: Payer: Self-pay

## 2015-01-03 DIAGNOSIS — Z1231 Encounter for screening mammogram for malignant neoplasm of breast: Secondary | ICD-10-CM

## 2015-02-02 ENCOUNTER — Ambulatory Visit: Payer: BC Managed Care – PPO

## 2015-02-07 ENCOUNTER — Ambulatory Visit
Admission: RE | Admit: 2015-02-07 | Discharge: 2015-02-07 | Disposition: A | Discharge status: Home / Self Care | Payer: BC Managed Care – PPO | Source: Ambulatory Visit

## 2015-02-07 DIAGNOSIS — Z1231 Encounter for screening mammogram for malignant neoplasm of breast: Secondary | ICD-10-CM

## 2015-08-24 ENCOUNTER — Other Ambulatory Visit: Payer: Self-pay | Admitting: Urology

## 2015-08-24 DIAGNOSIS — D4112 Neoplasm of uncertain behavior of left renal pelvis: Secondary | ICD-10-CM

## 2015-08-29 ENCOUNTER — Ambulatory Visit (HOSPITAL_COMMUNITY)
Admission: RE | Admit: 2015-08-29 | Discharge: 2015-08-29 | Disposition: A | Discharge status: Home / Self Care | Payer: BC Managed Care – PPO | Source: Ambulatory Visit | Attending: Urology | Admitting: Urology

## 2015-08-29 DIAGNOSIS — D4112 Neoplasm of uncertain behavior of left renal pelvis: Secondary | ICD-10-CM | POA: Diagnosis present

## 2015-08-29 DIAGNOSIS — N281 Cyst of kidney, acquired: Secondary | ICD-10-CM | POA: Insufficient documentation

## 2015-08-29 DIAGNOSIS — N2889 Other specified disorders of kidney and ureter: Secondary | ICD-10-CM | POA: Insufficient documentation

## 2015-08-29 MED ORDER — GADOBENATE DIMEGLUMINE 529 MG/ML IV SOLN
20.0000 mL | Freq: Once | INTRAVENOUS | Status: AC | PRN
  Administered 2015-08-29: 20 mL via INTRAVENOUS

## 2016-02-12 ENCOUNTER — Other Ambulatory Visit: Payer: Self-pay | Admitting: Obstetrics and Gynecology

## 2016-02-12 DIAGNOSIS — Z1231 Encounter for screening mammogram for malignant neoplasm of breast: Secondary | ICD-10-CM

## 2016-02-26 ENCOUNTER — Ambulatory Visit
Admission: RE | Admit: 2016-02-26 | Discharge: 2016-02-26 | Disposition: A | Discharge status: Home / Self Care | Payer: BC Managed Care – PPO | Source: Ambulatory Visit | Attending: Obstetrics and Gynecology | Admitting: Obstetrics and Gynecology

## 2016-02-26 DIAGNOSIS — Z1231 Encounter for screening mammogram for malignant neoplasm of breast: Secondary | ICD-10-CM

## 2017-05-14 ENCOUNTER — Other Ambulatory Visit: Payer: Self-pay | Admitting: Obstetrics and Gynecology

## 2017-05-14 DIAGNOSIS — Z1231 Encounter for screening mammogram for malignant neoplasm of breast: Secondary | ICD-10-CM

## 2017-06-15 ENCOUNTER — Ambulatory Visit
Admission: RE | Admit: 2017-06-15 | Discharge: 2017-06-15 | Disposition: A | Discharge status: Home / Self Care | Payer: BC Managed Care – PPO | Source: Ambulatory Visit | Attending: Obstetrics and Gynecology | Admitting: Obstetrics and Gynecology

## 2017-06-15 DIAGNOSIS — Z1231 Encounter for screening mammogram for malignant neoplasm of breast: Secondary | ICD-10-CM

## 2018-10-06 ENCOUNTER — Other Ambulatory Visit: Payer: Self-pay | Admitting: Obstetrics and Gynecology

## 2018-10-06 DIAGNOSIS — Z1231 Encounter for screening mammogram for malignant neoplasm of breast: Secondary | ICD-10-CM

## 2018-10-12 ENCOUNTER — Other Ambulatory Visit: Payer: Self-pay

## 2018-10-12 ENCOUNTER — Ambulatory Visit
Admission: RE | Admit: 2018-10-12 | Discharge: 2018-10-12 | Disposition: A | Discharge status: Home / Self Care | Payer: BC Managed Care – PPO | Source: Ambulatory Visit | Attending: Obstetrics and Gynecology | Admitting: Obstetrics and Gynecology

## 2018-10-12 DIAGNOSIS — Z1231 Encounter for screening mammogram for malignant neoplasm of breast: Secondary | ICD-10-CM

## 2018-10-14 ENCOUNTER — Ambulatory Visit: Payer: BC Managed Care – PPO

## 2020-07-03 DEATH — deceased
